# Patient Record
Sex: Male | Born: 1985 | Race: Black or African American | Hispanic: No | Smoking: Former smoker
Health system: Southern US, Community
[De-identification: ages and names within clinical notes are randomized; demographics above are authoritative.]

## PROBLEM LIST (undated history)

## (undated) ENCOUNTER — Emergency Department (HOSPITAL_COMMUNITY): Admission: EM | Payer: Self-pay

## (undated) HISTORY — PX: WRIST SURGERY: SHX841

---
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
## 2007-12-01 ENCOUNTER — Emergency Department (HOSPITAL_COMMUNITY): Admission: EM | Admit: 2007-12-01 | Discharge: 2007-12-01 | Payer: Self-pay | Admitting: Emergency Medicine

## 2009-07-16 ENCOUNTER — Emergency Department (HOSPITAL_COMMUNITY): Admission: EM | Admit: 2009-07-16 | Discharge: 2009-07-17 | Payer: Self-pay | Admitting: Emergency Medicine

## 2010-06-19 ENCOUNTER — Emergency Department (HOSPITAL_COMMUNITY)
Admission: EM | Admit: 2010-06-19 | Discharge: 2010-06-19 | Payer: Self-pay | Source: Home / Self Care | Admitting: Emergency Medicine

## 2011-02-23 LAB — URINALYSIS, ROUTINE W REFLEX MICROSCOPIC
Bilirubin Urine: NEGATIVE
Glucose, UA: NEGATIVE
Hgb urine dipstick: NEGATIVE
Ketones, ur: NEGATIVE
Nitrite: NEGATIVE
Protein, ur: NEGATIVE
Specific Gravity, Urine: 1.027
Urobilinogen, UA: 1
pH: 7.5

## 2011-02-23 LAB — POCT I-STAT, CHEM 8
Calcium, Ion: 1.07 — ABNORMAL LOW
Creatinine, Ser: 1.2
Glucose, Bld: 126 — ABNORMAL HIGH
Hemoglobin: 14.6
TCO2: 22

## 2011-02-23 LAB — CBC
Hemoglobin: 13
MCHC: 32.9
MCV: 85.1
RBC: 4.66

## 2011-02-23 LAB — RAPID URINE DRUG SCREEN, HOSP PERFORMED
Benzodiazepines: NOT DETECTED
Cocaine: NOT DETECTED
Tetrahydrocannabinol: POSITIVE — AB

## 2011-02-23 LAB — DIFFERENTIAL
Eosinophils Absolute: 0.2
Eosinophils Relative: 3
Lymphs Abs: 3.6
Monocytes Absolute: 0.8
Monocytes Relative: 9

## 2011-02-23 LAB — URINE CULTURE

## 2011-02-23 LAB — URINE MICROSCOPIC-ADD ON

## 2011-06-04 ENCOUNTER — Encounter: Payer: Self-pay | Admitting: Nurse Practitioner

## 2011-06-04 ENCOUNTER — Emergency Department (HOSPITAL_COMMUNITY)
Admission: EM | Admit: 2011-06-04 | Discharge: 2011-06-04 | Disposition: A | Discharge status: Home / Self Care | Payer: Self-pay | Attending: Emergency Medicine | Admitting: Emergency Medicine

## 2011-06-04 DIAGNOSIS — L0201 Cutaneous abscess of face: Secondary | ICD-10-CM | POA: Insufficient documentation

## 2011-06-04 DIAGNOSIS — R21 Rash and other nonspecific skin eruption: Secondary | ICD-10-CM | POA: Insufficient documentation

## 2011-06-04 DIAGNOSIS — L03211 Cellulitis of face: Secondary | ICD-10-CM | POA: Insufficient documentation

## 2011-06-04 MED ORDER — OXYCODONE-ACETAMINOPHEN 5-325 MG PO TABS: 2.0000 | ORAL_TABLET | ORAL | Status: AC | PRN

## 2011-06-04 MED ORDER — CEPHALEXIN 500 MG PO CAPS: ORAL_CAPSULE | ORAL | Status: AC

## 2011-06-04 MED ORDER — SULFAMETHOXAZOLE-TRIMETHOPRIM 800-160 MG PO TABS: 1.0000 | ORAL_TABLET | Freq: Two times a day (BID) | ORAL | Status: AC

## 2011-06-04 NOTE — ED Provider Notes (Signed)
Medical screening examination/treatment/procedure(s) were conducted as a shared visit with non-physician practitioner(s) and myself.  I personally evaluated the patient during the encounter  PA involved to perform procedure only.  Hurman Horn, MD 06/04/11 2126

## 2011-06-04 NOTE — ED Provider Notes (Signed)
History     CSN: [id number]  Arrival date & time 06/04/11  1046   First MD Initiated Contact with Patient 06/04/11 1101      Chief Complaint  Patient presents with  . Rash    (Consider location/radiation/quality/duration/timing/severity/associated sxs/prior treatment) HPI This 26 year old male has a couple of weeks of tender papules just under his chin in the submandibular jawline region with localized moderately severe tenderness without radiation or associated symptoms. He does not have any dental pain or intraoral pain. He has no odynophagia or dysphasia. He has no generalized neck swelling at all. He has no headache fever generalized rash shortness of breath chest pain or other concerns. He has not have any history to suggest that he is immunocompromised. History reviewed. No pertinent past medical history.  History reviewed. No pertinent past surgical history.  History reviewed. No pertinent family history.  History  Substance Use Topics  . Smoking status: Current Everyday Smoker -- 0.2 packs/day  . Smokeless tobacco: Not on file  . Alcohol Use: Yes     occassional      Review of Systems  Constitutional: Negative for fever.       10 Systems reviewed and are negative for acute change except as noted in the HPI.  HENT: Negative for congestion.   Eyes: Negative for discharge and redness.  Respiratory: Negative for cough and shortness of breath.   Cardiovascular: Negative for chest pain.  Gastrointestinal: Negative for vomiting and abdominal pain.  Musculoskeletal: Negative for back pain.  Skin: Positive for rash.  Neurological: Negative for syncope, numbness and headaches.  Psychiatric/Behavioral:       No behavior change.    Allergies  Review of patient's allergies indicates no known allergies.  Home Medications   Current Outpatient Rx  Name Route Sig Dispense Refill  . CEPHALEXIN 500 MG PO CAPS  2 caps po bid x 7 days 28 capsule 0  . OXYCODONE-ACETAMINOPHEN  5-325 MG PO TABS Oral Take 2 tablets by mouth every 4 (four) hours as needed for pain. 20 tablet 0  . SULFAMETHOXAZOLE-TRIMETHOPRIM 800-160 MG PO TABS Oral Take 1 tablet by mouth 2 (two) times daily. One po bid x 7 days 14 tablet 0    BP 115/77  Pulse 82  Temp(Src) 98.2 F (36.8 C) (Oral)  Resp 16  Ht 6\' 2"  (1.88 m)  Wt 160 lb (72.576 kg)  BMI 20.54 kg/m2  SpO2 99%  Physical Exam  Nursing note and vitals reviewed. Constitutional:       Awake, alert, nontoxic appearance.  HENT:  Head: Atraumatic.  Mouth/Throat: Oropharynx is clear and moist. No oropharyngeal exudate.       The patient's submandibular region just under his jawline under the chin shows several tender erythematous papules 2 of which are fluctuant with limited bedside ultrasound revealing small fluid collections in the subcutaneous space suggestive of 2 small abscesses, with some surrounding induration erythema and warmth of the skin consistent with localized cellulitis without subcutaneous emphysema and there is no diffuse neck swelling or tenderness at all the cellulitis and an apparent abscesses or well localized to the anterior submandibular region just under the mandible line itself  Eyes: Pupils are equal, round, and reactive to light. Right eye exhibits no discharge. Left eye exhibits no discharge.  Neck: Neck supple.  Cardiovascular: Normal rate and regular rhythm.   No murmur heard. Pulmonary/Chest: Effort normal and breath sounds normal. No respiratory distress. He has no wheezes. He has no rales. He exhibits  no tenderness.  Abdominal: Soft. There is no tenderness. There is no rebound.  Musculoskeletal: He exhibits no tenderness.       Baseline ROM, no obvious new focal weakness.  Lymphadenopathy:    He has no cervical adenopathy.  Neurological:       Mental status and motor strength appears baseline for patient and situation.  Skin: No rash noted.  Psychiatric: He has a normal mood and affect.    ED Course   Procedures (including critical care time) Medical screening examination/treatment/procedure(s) were conducted as a shared visit with non-physician practitioner(s) and myself.  I personally evaluated the patient during the encounter.  physician's assistant only performed incision and drainage procedures under my supervision. Labs Reviewed - No data to display No results found.   1. Abscess of face       MDM  I doubt any other EMC precluding discharge at this time including, but not necessarily limited to the following:deep space neck infection, airway compromise.        Hurman Horn, MD 06/04/11 2204

## 2011-06-04 NOTE — ED Provider Notes (Signed)
INCISION AND DRAINAGE Performed by: Lorenz Coaster Consent: Verbal consent obtained. Risks and benefits: risks, benefits and alternatives were discussed Time out performed prior to procedure. Type: abscess  Body area: chin- 2 areas to chin were incised (documented on same procedure note as same body part)  Anesthesia: local infiltration  Local anesthetic: lidocaine 2% without epinephrine  Anesthetic total: 1.5 ml  Complexity: complex Blunt dissection to break up loculations  Drainage: purulent  Drainage amount: Moderate amt purulent drainage expressed from left-sided abscess; small amt blood drainage from right-sided  Packing material: no packing placed  Patient tolerance: Patient tolerated the procedure well with no immediate complications.     Elwyn Reach [place], [place] 06/04/11 1210

## 2011-06-04 NOTE — ED Notes (Signed)
Pt with several bumps under chin that are painful, red, swollen, thinks they are hair bumps. X 2 weeks

## 2011-09-16 ENCOUNTER — Emergency Department (HOSPITAL_COMMUNITY)
Admission: EM | Admit: 2011-09-16 | Discharge: 2011-09-16 | Disposition: A | Discharge status: Home / Self Care | Payer: Self-pay | Attending: Emergency Medicine | Admitting: Emergency Medicine

## 2011-09-16 ENCOUNTER — Encounter (HOSPITAL_COMMUNITY): Payer: Self-pay | Admitting: Emergency Medicine

## 2011-09-16 DIAGNOSIS — R220 Localized swelling, mass and lump, head: Secondary | ICD-10-CM | POA: Insufficient documentation

## 2011-09-16 DIAGNOSIS — L0201 Cutaneous abscess of face: Secondary | ICD-10-CM | POA: Insufficient documentation

## 2011-09-16 DIAGNOSIS — L0390 Cellulitis, unspecified: Secondary | ICD-10-CM

## 2011-09-16 DIAGNOSIS — L03211 Cellulitis of face: Secondary | ICD-10-CM | POA: Insufficient documentation

## 2011-09-16 DIAGNOSIS — F17200 Nicotine dependence, unspecified, uncomplicated: Secondary | ICD-10-CM | POA: Insufficient documentation

## 2011-09-16 DIAGNOSIS — R221 Localized swelling, mass and lump, neck: Secondary | ICD-10-CM | POA: Insufficient documentation

## 2011-09-16 MED ORDER — SULFAMETHOXAZOLE-TRIMETHOPRIM 800-160 MG PO TABS: 1.0000 | ORAL_TABLET | Freq: Two times a day (BID) | ORAL | Status: AC

## 2011-09-16 MED ORDER — CLINDAMYCIN HCL 150 MG PO CAPS: 150.0000 mg | ORAL_CAPSULE | Freq: Four times a day (QID) | ORAL | Status: AC

## 2011-09-16 NOTE — Discharge Instructions (Signed)
Cellulitis Return to the Emergency Department right away if the infection becomes worse, you are unable to open your mouth, you develop fever of 102, you are unable to move your neck, or any other symptoms that are concerning to you.  Apply warm compresses to the area.  Take antibiotics as prescribed.  Cellulitis is an infection of the tissue under the skin. The infected area is usually red and tender. This is caused by germs. These germs enter the body through cuts or sores. This usually happens in the arms or lower legs. HOME CARE   Take your medicine as told. Finish it even if you start to feel better.   If the infection is on the arm or leg, keep it raised (elevated).   Use a warm cloth on the infected area several times a day.   See your doctor for a follow-up visit as told.  GET HELP RIGHT AWAY IF:   You are tired or confused.   You throw up (vomit).   You have watery poop (diarrhea).   You feel ill and have muscle aches.   You have a fever.  MAKE SURE YOU:   Understand these instructions.   Will watch your condition.   Will get help right away if you are not doing well or get worse.  Document Released: 11/01/2007 Document Revised: 05/04/2011 Document Reviewed: 04/16/2009 [facility] Patient Information 2012 [place], [name].

## 2011-09-16 NOTE — ED Notes (Signed)
Pt c/o facial swelling onset yesterday. Pt reports, "I think it's another abcess."

## 2011-09-16 NOTE — ED Provider Notes (Signed)
History     CSN: [id number]  Arrival date & time 09/16/11  5643   First MD Initiated Contact with Patient 09/16/11 1033      Chief Complaint  Patient presents with  . Facial Swelling    (Consider location/radiation/quality/duration/timing/severity/associated sxs/prior treatment) HPI Comments: Patient with history of abscesses comes in today with a chief complaint of right sided facial swelling since yesterday afternoon.  Swelling located just superior to the mandible on the right side.  He reports that the swelling has become progressively worse.  He also notes that the area has become thickened.  No drainage from the area.  He denies any fever or chills.  He denies any erythema or warmth to the area.  He denies any dental pain or dental abscess.  No trismus.  He is not diabetic.    The history is provided by the patient.    History reviewed. No pertinent past medical history.  History reviewed. No pertinent past surgical history.  History reviewed. No pertinent family history.  History  Substance Use Topics  . Smoking status: Current Everyday Smoker -- 0.2 packs/day  . Smokeless tobacco: Not on file  . Alcohol Use: Yes     occassional      Review of Systems  Constitutional: Negative for fever and chills.  HENT: Positive for facial swelling. Negative for drooling, trouble swallowing, neck pain, neck stiffness and voice change.   Respiratory: Negative for shortness of breath.   Gastrointestinal: Negative for vomiting.  Skin: Negative for color change.    Allergies  Review of patient's allergies indicates no known allergies.  Home Medications  No current outpatient prescriptions on file.  BP 119/76  Pulse 60  Temp(Src) 97.9 F (36.6 C) (Oral)  Resp 16  SpO2 100%  Physical Exam  Nursing note and vitals reviewed. Constitutional: He appears well-developed and well-nourished. No distress.  HENT:  Head: Normocephalic and atraumatic. No trismus in the jaw.  Right  Ear: Tympanic membrane normal.  Left Ear: Tympanic membrane normal.  Mouth/Throat: Uvula is midline, oropharynx is clear and moist and mucous membranes are normal. No dental abscesses or uvula swelling.  Neck: Normal range of motion. Neck supple.  Cardiovascular: Normal rate, regular rhythm and normal heart sounds.   Pulmonary/Chest: Effort normal and breath sounds normal.  Neurological: He is alert.  Skin: He is not diaphoretic.       Swelling and induration just superior to the mandible on the right side.  No warmth or obvious erythema.  No fluctuance present.  Psychiatric: He has a normal mood and affect.    ED Course  Procedures (including critical care time)  Labs Reviewed - No data to display No results found.   No diagnosis found.    MDM  Facial abscess not amendable to drainage at this time.  Patient afebrile.  No trismus.  Full ROM of the neck. No acute distress.  Patient given Antibiotics and instructed to apply warm compresses and follow up with PCP.  Return precautions discussed with patient.          Pascal Lux [place], PA-C 09/16/11 1821

## 2011-09-17 NOTE — ED Provider Notes (Signed)
Medical screening examination/treatment/procedure(s) were performed by non-physician practitioner and as supervising physician I was immediately available for consultation/collaboration.   Nitisha Civello, MD 09/17/11 2048 

## 2012-10-27 ENCOUNTER — Encounter (HOSPITAL_COMMUNITY): Payer: Self-pay | Admitting: Emergency Medicine

## 2012-10-27 ENCOUNTER — Emergency Department (HOSPITAL_COMMUNITY)
Admission: EM | Admit: 2012-10-27 | Discharge: 2012-10-27 | Disposition: A | Discharge status: Home / Self Care | Payer: Self-pay | Attending: Emergency Medicine | Admitting: Emergency Medicine

## 2012-10-27 DIAGNOSIS — Y939 Activity, unspecified: Secondary | ICD-10-CM | POA: Insufficient documentation

## 2012-10-27 DIAGNOSIS — L02414 Cutaneous abscess of left upper limb: Secondary | ICD-10-CM

## 2012-10-27 DIAGNOSIS — T63391A Toxic effect of venom of other spider, accidental (unintentional), initial encounter: Secondary | ICD-10-CM | POA: Insufficient documentation

## 2012-10-27 DIAGNOSIS — F17200 Nicotine dependence, unspecified, uncomplicated: Secondary | ICD-10-CM | POA: Insufficient documentation

## 2012-10-27 DIAGNOSIS — Y929 Unspecified place or not applicable: Secondary | ICD-10-CM | POA: Insufficient documentation

## 2012-10-27 DIAGNOSIS — IMO0002 Reserved for concepts with insufficient information to code with codable children: Secondary | ICD-10-CM | POA: Insufficient documentation

## 2012-10-27 MED ORDER — SULFAMETHOXAZOLE-TRIMETHOPRIM 800-160 MG PO TABS: 1.0000 | ORAL_TABLET | Freq: Two times a day (BID) | ORAL | Status: DC

## 2012-10-27 MED ORDER — CEPHALEXIN 500 MG PO CAPS: 500.0000 mg | ORAL_CAPSULE | Freq: Two times a day (BID) | ORAL | Status: DC

## 2012-10-27 MED ORDER — IBUPROFEN 800 MG PO TABS: 800.0000 mg | ORAL_TABLET | Freq: Three times a day (TID) | ORAL | Status: DC

## 2012-10-27 MED ORDER — HYDROCODONE-ACETAMINOPHEN 5-325 MG PO TABS: 1.0000 | ORAL_TABLET | Freq: Four times a day (QID) | ORAL | Status: DC | PRN

## 2012-10-27 NOTE — ED Notes (Signed)
?   Spider bite to L elbow x 2 days.  C/o site draining today.

## 2012-10-27 NOTE — ED Notes (Signed)
Cleaned left elbow with wound cleanser placed bacitracin and 2x2 gauze with kerlex patient tolerated without incident.

## 2012-10-27 NOTE — ED Provider Notes (Signed)
History    This chart was scribed for non-physician practitioner, Lottie Mussel, PA-C, working with Geoffery Lyons, MD by Melba Coon, ED Scribe. This patient was seen in room TR08C/TR08C and the patient's care was started at 5:22PM.   CSN: [id number]  Arrival date & time 10/27/12  1514   None     Chief Complaint  Patient presents with  . Insect Bite    (Consider location/radiation/quality/duration/timing/severity/associated sxs/prior treatment) The history is provided by the patient. No language interpreter was used.   HPI Comments: Nicholas Webster is a 27 y.o. male who presents to the Emergency Department complaining of an abscess with constant, moderate pain to the left elbow with an onset 2 days ago with spreading erythema and warmth to his left arm. He reports a history of similar boils, particularly under his chin where his beard hair grows. He reports he thinks it has been spider bites causing these boils, but he did not see a spider bite him around onset. The abscess started draining today per his report. He reports that he cannot bend his arm without pain. Warm compresses alleviate his pain. He has never been tested for MRSA. No known allergies. No other pertinent medical symptoms.  History reviewed. No pertinent past medical history.  History reviewed. No pertinent past surgical history.  No family history on file.  History  Substance Use Topics  . Smoking status: Current Every Day Smoker -- 0.20 packs/day  . Smokeless tobacco: Not on file  . Alcohol Use: Yes     Comment: occassional      Review of Systems  Constitutional: Negative for fever and diaphoresis.  HENT: Negative for neck pain and neck stiffness.   Eyes: Negative for visual disturbance.  Respiratory: Negative for apnea, chest tightness and shortness of breath.   Cardiovascular: Negative for chest pain and palpitations.  Gastrointestinal: Negative for nausea, vomiting, diarrhea and constipation.   Genitourinary: Negative for dysuria.  Musculoskeletal: Negative for gait problem.  Skin: Positive for wound (spider bite). Negative for rash.  Neurological: Negative for dizziness, weakness, light-headedness, numbness and headaches.  All other systems reviewed and are negative.    Allergies  Review of patient's allergies indicates no known allergies.  Home Medications   Current Outpatient Rx  Name  Route  Sig  Dispense  Refill  . ibuprofen (ADVIL,MOTRIN) 200 MG tablet   Oral   Take 600 mg by mouth every 6 (six) hours as needed for pain.           BP 116/69  Pulse 55  Temp(Src) 98.2 F (36.8 C) (Oral)  Resp 16  SpO2 100%  Physical Exam  Nursing note and vitals reviewed. Constitutional: He is oriented to person, place, and time. He appears well-developed. No distress.  HENT:  Head: Normocephalic and atraumatic.  Eyes: Conjunctivae are normal.  Neck: Normal range of motion. No tracheal deviation present.  Cardiovascular: Normal rate.   Pulmonary/Chest: Effort normal. No respiratory distress.  Abdominal: Soft.  Musculoskeletal: Normal range of motion. He exhibits tenderness. He exhibits no edema.  3x3 cm draining superficial abscess to the left elbow with purulent drainage and TTP. Mild surrounding cellulitis. Full ROM of the left elbow with pain.  Neurological: He is alert and oriented to person, place, and time.  Skin: Skin is warm. No rash noted. He is not diaphoretic.  Psychiatric: He has a normal mood and affect. His behavior is normal.    ED Course  Procedures (including critical care time)  COORDINATION  OF CARE:  5:28PM - Keflex and bactrim will be ordered for [employer]. Area is already well draining on its owning  so I&D is not indicated. More purulent drainage is extracted from the area (about 1-2 cc here at the ED) and will be sent to the lab for culture. Advised to continue to apply warm compresses to the affected area. He is ready for  d/c.   Labs Reviewed - No data to display No results found.   1. Abscess of left arm       MDM  Pt with skin abscess to the left elbow. Full ROM of the elbow with no pain, doubt septic joint. Pt with hx of biols in the past, sent culture for MRSA testing. Abscess is spontaneously draining. Used pressure to express most of the pus, large purulent drainage expressed. Dressing applied. Home with keflex and bactrim. Pain medications. Recheck in two days.   Filed Vitals:   10/27/12 1519  BP: 116/69  Pulse: 55  Temp: 98.2 F (36.8 C)  TempSrc: Oral  Resp: 16  SpO2: 100%    I personally performed the services described in this documentation, which was scribed in my presence. The recorded information has been reviewed and is accurate.        Lottie Mussel, PA-C 10/27/12 1739

## 2012-10-28 NOTE — ED Provider Notes (Signed)
Medical screening examination/treatment/procedure(s) were performed by non-physician practitioner and as supervising physician I was immediately available for consultation/collaboration.  Tasharra Nodine, MD 10/28/12 0826 

## 2012-10-30 ENCOUNTER — Telehealth (HOSPITAL_COMMUNITY): Payer: Self-pay | Admitting: Emergency Medicine

## 2012-10-30 LAB — WOUND CULTURE

## 2012-10-30 NOTE — Telephone Encounter (Signed)
Patient notified of + MRSA wound culture. Infection control instructions provided, patient verbalized understanding.

## 2012-10-30 NOTE — ED Notes (Signed)
+   wound culture + MRSA.  treated with Septra DS, sensitive to same. Will call and notify patient

## 2012-10-31 ENCOUNTER — Telehealth (HOSPITAL_COMMUNITY): Payer: Self-pay | Admitting: Emergency Medicine

## 2012-10-31 NOTE — ED Notes (Signed)
Post ED Visit - Positive Culture Follow-up  Culture report reviewed by antimicrobial stewardship pharmacist: []  Wes Dulaney, Pharm.D., BCPS []  Celedonio Miyamoto, Pharm.D., BCPS [x]  Georgina Pillion, Pharm.D., BCPS []  [place], [address].D., BCPS, AAHIVP []  Estella Husk, Pharm.D., BCPS, AAHIVP  Positive urine culture Treated with Bactrim, organism sensitive to the same and no further patient follow-up is required at this time.  Kylie A Holland 10/31/2012, 12:25 PM

## 2013-09-18 ENCOUNTER — Encounter (HOSPITAL_COMMUNITY): Payer: Self-pay | Admitting: Emergency Medicine

## 2013-09-18 ENCOUNTER — Emergency Department (HOSPITAL_COMMUNITY): Payer: Self-pay

## 2013-09-18 ENCOUNTER — Emergency Department (HOSPITAL_COMMUNITY)
Admission: EM | Admit: 2013-09-18 | Discharge: 2013-09-18 | Disposition: A | Discharge status: Home / Self Care | Payer: Self-pay | Attending: Emergency Medicine | Admitting: Emergency Medicine

## 2013-09-18 DIAGNOSIS — F17200 Nicotine dependence, unspecified, uncomplicated: Secondary | ICD-10-CM | POA: Insufficient documentation

## 2013-09-18 DIAGNOSIS — M25561 Pain in right knee: Secondary | ICD-10-CM

## 2013-09-18 DIAGNOSIS — L03119 Cellulitis of unspecified part of limb: Principal | ICD-10-CM

## 2013-09-18 DIAGNOSIS — L02419 Cutaneous abscess of limb, unspecified: Secondary | ICD-10-CM | POA: Insufficient documentation

## 2013-09-18 DIAGNOSIS — L03115 Cellulitis of right lower limb: Secondary | ICD-10-CM

## 2013-09-18 LAB — CBC WITH DIFFERENTIAL/PLATELET
BASOS PCT: 0 % (ref 0–1)
Basophils Absolute: 0 10*3/uL (ref 0.0–0.1)
EOS ABS: 0 10*3/uL (ref 0.0–0.7)
EOS PCT: 0 % (ref 0–5)
HCT: 39.7 % (ref 39.0–52.0)
HEMOGLOBIN: 12.9 g/dL — AB (ref 13.0–17.0)
LYMPHS ABS: 3.7 10*3/uL (ref 0.7–4.0)
Lymphocytes Relative: 30 % (ref 12–46)
MCH: 27.4 pg (ref 26.0–34.0)
MCHC: 32.5 g/dL (ref 30.0–36.0)
MCV: 84.3 fL (ref 78.0–100.0)
MONO ABS: 1.6 10*3/uL — AB (ref 0.1–1.0)
Monocytes Relative: 13 % — ABNORMAL HIGH (ref 3–12)
NEUTROS PCT: 57 % (ref 43–77)
Neutro Abs: 7 10*3/uL (ref 1.7–7.7)
PLATELETS: 218 10*3/uL (ref 150–400)
RBC: 4.71 MIL/uL (ref 4.22–5.81)
RDW: 13.7 % (ref 11.5–15.5)
WBC: 12.3 10*3/uL — ABNORMAL HIGH (ref 4.0–10.5)

## 2013-09-18 LAB — BASIC METABOLIC PANEL
BUN: 13 mg/dL (ref 6–23)
CALCIUM: 9.3 mg/dL (ref 8.4–10.5)
CO2: 28 meq/L (ref 19–32)
CREATININE: 1.02 mg/dL (ref 0.50–1.35)
Chloride: 101 mEq/L (ref 96–112)
GFR calc Af Amer: >90 mL/min (ref >90)
GLUCOSE: 115 mg/dL — AB (ref 70–99)
Potassium: 4.7 mEq/L (ref 3.7–5.3)
SODIUM: 139 meq/L (ref 137–147)

## 2013-09-18 LAB — I-STAT CG4 LACTIC ACID, ED: Lactic Acid, Venous: 1.3 mmol/L (ref 0.5–2.2)

## 2013-09-18 MED ORDER — CEPHALEXIN 250 MG PO CAPS: 250.0000 mg | ORAL_CAPSULE | Freq: Four times a day (QID) | ORAL | Status: DC

## 2013-09-18 MED ORDER — HYDROCODONE-ACETAMINOPHEN 5-325 MG PO TABS
2.0000 | ORAL_TABLET | Freq: Once | ORAL | Status: AC
  Administered 2013-09-18: 2 via ORAL
  Filled 2013-09-18: qty 2

## 2013-09-18 MED ORDER — SULFAMETHOXAZOLE-TRIMETHOPRIM 800-160 MG PO TABS: 1.0000 | ORAL_TABLET | Freq: Two times a day (BID) | ORAL | Status: AC

## 2013-09-18 NOTE — ED Provider Notes (Signed)
Medical screening examination/treatment/procedure(s) were performed by non-physician practitioner and as supervising physician I was immediately available for consultation/collaboration.   EKG Interpretation None       Elijah Michaelis M Steffie Waggoner, MD 09/18/13 0559 

## 2013-09-18 NOTE — Discharge Instructions (Signed)
Cellulitis Cellulitis is an infection of the skin and the tissue beneath it. The infected area is usually red and tender. Cellulitis occurs most often in the arms and lower legs.  CAUSES  Cellulitis is caused by bacteria that enter the skin through cracks or cuts in the skin. The most common types of bacteria that cause cellulitis are Staphylococcus and Streptococcus. SYMPTOMS   Redness and warmth.  Swelling.  Tenderness or pain.  Fever. DIAGNOSIS  Your caregiver can usually determine what is wrong based on a physical exam. Blood tests may also be done. TREATMENT  Treatment usually involves taking an antibiotic medicine. HOME CARE INSTRUCTIONS   Take your antibiotics as directed. Finish them even if you start to feel better.  Keep the infected arm or leg elevated to reduce swelling.  Apply a warm cloth to the affected area up to 4 times per day to relieve pain.  Only take over-the-counter or prescription medicines for pain, discomfort, or fever as directed by your caregiver.  Keep all follow-up appointments as directed by your caregiver. SEEK MEDICAL CARE IF:   You notice red streaks coming from the infected area.  Your red area gets larger or turns dark in color.  Your bone or joint underneath the infected area becomes painful after the skin has healed.  Your infection returns in the same area or another area.  You notice a swollen bump in the infected area.  You develop new symptoms. SEEK IMMEDIATE MEDICAL CARE IF:   You have a fever.  You feel very sleepy.  You develop vomiting or diarrhea.  You have a general ill feeling (malaise) with muscle aches and pains. MAKE SURE YOU:   Understand these instructions.  Will watch your condition.  Will get help right away if you are not doing well or get worse. Document Released: 02/22/2005 Document Revised: 11/14/2011 Document Reviewed: 07/31/2011 [employer] Patient Information 2014 [employer].  

## 2013-09-18 NOTE — ED Notes (Signed)
Pt reports a insect bite to R knee that he noticed 3 days ago and now has became swollen and has a scab over it. Pt states that area is painful. Pt ambulatory to triage area. Family in room.

## 2013-09-18 NOTE — ED Provider Notes (Signed)
CSN: [id number]     Arrival date & time 09/18/13  0101 History   First MD Initiated Contact with Patient 09/18/13 0123     Chief Complaint  Patient presents with  . Insect Bite    (Consider location/radiation/quality/duration/timing/severity/associated sxs/prior Treatment) HPI Comments: Patient is a 28 year old otherwise healthy male who presents to the emergency department for right knee swelling. Patient states that he believed a spider bit him 3 days ago. Patient did not see the spider. He denies taking anything for symptoms. He states that since onset he has become increasingly swollen. Patient also endorses drainage yesterday which has since scabbed over. Patient endorses pain that is nonradiating and worse with knee flexion. Patient denies associated trauma or injury to his knee, fever, red streaking, numbness/tingling, and extremity weakness.  The history is provided by the patient. No language interpreter was used.    History reviewed. No pertinent past medical history. History reviewed. No pertinent past surgical history. History reviewed. No pertinent family history. History  Substance Use Topics  . Smoking status: Current Every Day Smoker -- 0.20 packs/day  . Smokeless tobacco: Not on file  . Alcohol Use: Yes     Comment: occassional    Review of Systems  Constitutional: Negative for fever.  Musculoskeletal: Positive for arthralgias, joint swelling and myalgias.  Skin: Positive for color change.  Neurological: Negative for weakness and numbness.  All other systems reviewed and are negative.     Allergies  Review of patient's allergies indicates no known allergies.  Home Medications   Prior to Admission medications   Medication Sig Start Date End Date Taking? Authorizing Provider  ibuprofen (ADVIL,MOTRIN) 200 MG tablet Take 600 mg by mouth every 6 (six) hours as needed for pain.   Yes Historical Provider, MD   BP 121/75  Pulse 60  Temp(Src) 98.2 F (36.8 C)  (Oral)  Resp 16  SpO2 99%  Physical Exam  Nursing note and vitals reviewed. Constitutional: He is oriented to person, place, and time. He appears well-developed and well-nourished. No distress.  HENT:  Head: Normocephalic and atraumatic.  Mouth/Throat: Oropharynx is clear and moist. No oropharyngeal exudate.  Eyes: Conjunctivae and EOM are normal. No scleral icterus.  Neck: Normal range of motion.  Cardiovascular: Normal rate, regular rhythm and intact distal pulses.   Pulmonary/Chest: Effort normal. No respiratory distress.  Musculoskeletal: Normal range of motion.       Right knee: He exhibits swelling and erythema. He exhibits normal range of motion, no effusion, no ecchymosis, no deformity, normal alignment, no LCL laxity and no MCL laxity. Tenderness (diffuse to anteror R knee) found.       Right upper leg: Normal.       Right lower leg: Normal.  Patient with swelling to anterior inferior R knee with erythema and mild heat to touch. Purulent pocket noted directly under surface of skin. Finding suggestive of soft tissue abscess and cellulitis.  Neurological: He is alert and oriented to person, place, and time. He exhibits normal muscle tone.  No gross sensory deficits appreciated to RLE. Patient able to wiggle all toes. Patellar and achilles reflexes 2+ b/l.  Skin: Skin is warm and dry. No rash noted. No erythema. No pallor.  Psychiatric: He has a normal mood and affect. His behavior is normal.    ED Course  Procedures (including critical care time) Labs Review Labs Reviewed  CBC WITH DIFFERENTIAL - Abnormal; Notable for the following:    WBC 12.3 (*)    Hemoglobin  12.9 (*)    Monocytes Relative 13 (*)    Monocytes Absolute 1.6 (*)    All other components within normal limits  BASIC METABOLIC PANEL - Abnormal; Notable for the following:    Glucose, Bld 115 (*)    All other components within normal limits  WOUND CULTURE  I-STAT CG4 LACTIC ACID, ED    Imaging Review Dg  Knee Complete 4 Views Right  09/18/2013   CLINICAL DATA:  Insect bite  EXAM: RIGHT KNEE - COMPLETE 4+ VIEW  COMPARISON:  None.  FINDINGS: No acute fracture. No dislocation. Soft tissue swelling over the anterior proximal tibia is noted.  IMPRESSION: No acute bony pathology. Soft tissue swelling over the proximal tibia.   Electronically Signed   By: Maryclare BeanArt  Hoss M.D.   On: 09/18/2013 02:35     EKG Interpretation None     INCISION AND DRAINAGE Performed by: Antony MaduraKelly Samit Sylve Consent: Verbal consent obtained. Risks and benefits: risks, benefits and alternatives were discussed Type: abscess  Body area: R knee  Anesthesia: local infiltration  Incision was made with a scalpel.  Local anesthetic: lidocaine 1% with epinephrine  Anesthetic total: 7 ml  Complexity: complex Blunt dissection to break up loculations  Drainage: purulent  Drainage amount: moderate  Packing material: none  Patient tolerance: Patient tolerated the procedure well with no immediate complications.   MDM   Final diagnoses:  Knee pain, right  Cellulitis of knee, right    Patient presents to pain and swelling to R knee x 3 days. Patient believes he was bit by a spider. He is neurovascularly intact without sensory deficits on exam. Physical exam findings c/w with abscess and cellulitis. Do not suspect septic joint in this patient. No bony pathology or subcutaneous gas on Xray. I&D performed at bedside which patient tolerated well. Will d/c with Bactrim and Keflex with instruction to return if symptoms worsen. Patient agreeable to plan with no unaddressed concerns.  Patient seen also by Dr. Norlene Campbelltter who is in agreement with this workup, assessment, management plan, and patient's stability for discharge.   Filed Vitals:   09/18/13 0106 09/18/13 0423 09/18/13 0501  BP: 122/63 121/75   Pulse: 79 60   Temp: 98.2 F (36.8 C)    TempSrc: Oral    Resp: 18  16  SpO2: 100% 98% 99%     Antony MaduraKelly Shanon Seawright, PA-C 09/18/13 0522

## 2013-09-20 LAB — WOUND CULTURE

## 2013-09-21 ENCOUNTER — Telehealth (HOSPITAL_BASED_OUTPATIENT_CLINIC_OR_DEPARTMENT_OTHER): Payer: Self-pay | Admitting: Emergency Medicine

## 2013-09-21 NOTE — Telephone Encounter (Signed)
Post ED Visit - Positive Culture Follow-up  Culture report reviewed by antimicrobial stewardship pharmacist: []  Wes Dulaney, Pharm.D., BCPS []  Celedonio MiyamotoJeremy Frens, Pharm.D., BCPS [x]  Georgina PillionElizabeth Martin, [address]Ph[address]D., BCPS []  ClearlakeMinh Pham, [place]D., BCPS, AAHIVP []  Estella HuskMichelle Turner, Pharm.D., BCPS, AAHIVP []  Harvie JuniorNathan Cope, Pharm.D.  Positive wound culture Treated with Sulfa-Trimeth, organism sensitive to the same and no further patient follow-up is required at this time.  Shondell Poulson 09/21/2013, 3:05 PM

## 2014-01-21 ENCOUNTER — Encounter (HOSPITAL_COMMUNITY): Payer: Self-pay | Admitting: Emergency Medicine

## 2014-01-21 ENCOUNTER — Emergency Department (HOSPITAL_COMMUNITY)
Admission: EM | Admit: 2014-01-21 | Discharge: 2014-01-21 | Disposition: A | Discharge status: Home / Self Care | Payer: Self-pay | Attending: Family Medicine | Admitting: Family Medicine

## 2014-01-21 DIAGNOSIS — M62830 Muscle spasm of back: Secondary | ICD-10-CM

## 2014-01-21 DIAGNOSIS — M542 Cervicalgia: Secondary | ICD-10-CM | POA: Insufficient documentation

## 2014-01-21 DIAGNOSIS — M549 Dorsalgia, unspecified: Secondary | ICD-10-CM | POA: Insufficient documentation

## 2014-01-21 DIAGNOSIS — F17200 Nicotine dependence, unspecified, uncomplicated: Secondary | ICD-10-CM | POA: Insufficient documentation

## 2014-01-21 DIAGNOSIS — Z79899 Other long term (current) drug therapy: Secondary | ICD-10-CM | POA: Insufficient documentation

## 2014-01-21 DIAGNOSIS — M62838 Other muscle spasm: Secondary | ICD-10-CM | POA: Insufficient documentation

## 2014-01-21 MED ORDER — CYCLOBENZAPRINE HCL 10 MG PO TABS: 10.0000 mg | ORAL_TABLET | Freq: Two times a day (BID) | ORAL | Status: DC | PRN

## 2014-01-21 MED ORDER — IBUPROFEN 800 MG PO TABS: 800.0000 mg | ORAL_TABLET | Freq: Three times a day (TID) | ORAL | Status: DC

## 2014-01-21 NOTE — ED Notes (Signed)
Reports neck and upper back pain x 3 days. Pain increases with movement. Ambulatory at triage, no acute distress noted.

## 2014-01-21 NOTE — Discharge Instructions (Signed)
Back Exercises °Back exercises help treat and prevent back injuries. The goal of back exercises is to increase the strength of your abdominal and back muscles and the flexibility of your back. These exercises should be started when you no longer have back pain. Back exercises include: °· Pelvic Tilt. Lie on your back with your knees bent. Tilt your pelvis until the lower part of your back is against the floor. Hold this position 5 to 10 sec and repeat 5 to 10 times. °· Knee to Chest. Pull first 1 knee up against your chest and hold for 20 to 30 seconds, repeat this with the other knee, and then both knees. This may be done with the other leg straight or bent, whichever feels better. °· Sit-Ups or Curl-Ups. Bend your knees 90 degrees. Start with tilting your pelvis, and do a partial, slow sit-up, lifting your trunk only 30 to 45 degrees off the floor. Take at least 2 to 3 seconds for each sit-up. Do not do sit-ups with your knees out straight. If partial sit-ups are difficult, simply do the above but with only tightening your abdominal muscles and holding it as directed. °· Hip-Lift. Lie on your back with your knees flexed 90 degrees. Push down with your feet and shoulders as you raise your hips a couple inches off the floor; hold for 10 seconds, repeat 5 to 10 times. °· Back arches. Lie on your stomach, propping yourself up on bent elbows. Slowly press on your hands, causing an arch in your low back. Repeat 3 to 5 times. Any initial stiffness and discomfort should lessen with repetition over time. °· Shoulder-Lifts. Lie face down with arms beside your body. Keep hips and torso pressed to floor as you slowly lift your head and shoulders off the floor. °Do not overdo your exercises, especially in the beginning. Exercises may cause you some mild back discomfort which lasts for a few minutes; however, if the pain is more severe, or lasts for more than 15 minutes, do not continue exercises until you see your caregiver.  Improvement with exercise therapy for back problems is slow.  °See your caregivers for assistance with developing a proper back exercise program. °Document Released: 06/22/2004 Document Revised: 08/07/2011 Document Reviewed: 03/16/2011 °ExitCare® Patient Information ©2015 [employer]. This information is not intended to replace advice given to you by your health care provider. Make sure you discuss any questions you have with your health care provider. °Muscle Cramps and Spasms °Muscle cramps and spasms occur when a muscle or muscles tighten and you have no control over this tightening (involuntary muscle contraction). They are a common problem and can develop in any muscle. The most common place is in the calf muscles of the leg. Both muscle cramps and muscle spasms are involuntary muscle contractions, but they also have differences:  °· Muscle cramps are sporadic and painful. They may last a few seconds to a quarter of an hour. Muscle cramps are often more forceful and last longer than muscle spasms. °· Muscle spasms may or may not be painful. They may also last just a few seconds or much longer. °CAUSES  °It is uncommon for cramps or spasms to be due to a serious underlying problem. In many cases, the cause of cramps or spasms is unknown. Some common causes are:  °· Overexertion.   °· Overuse from repetitive motions (doing the same thing over and over).   °· Remaining in a certain position for a long period of time.   °· Improper   preparation, form, or technique while performing a sport or activity.   °· Dehydration.   °· Injury.   °· Side effects of some medicines.   °· Abnormally low levels of the salts and ions in your blood (electrolytes), especially potassium and calcium. This could happen if you are taking water pills (diuretics) or you are pregnant.   °Some underlying medical problems can make it more likely to develop cramps or spasms. These include, but are not limited to:  °· Diabetes.   °· Parkinson  disease.   °· Hormone disorders, such as thyroid problems.   °· Alcohol abuse.   °· Diseases specific to muscles, joints, and bones.   °· Blood vessel disease where not enough blood is getting to the muscles.   °HOME CARE INSTRUCTIONS  °· Stay well hydrated. Drink enough water and fluids to keep your urine clear or pale yellow. °· It may be helpful to massage, stretch, and relax the affected muscle. °· For tight or tense muscles, use a warm towel, heating pad, or hot shower water directed to the affected area. °· If you are sore or have pain after a cramp or spasm, applying ice to the affected area may relieve discomfort. °¨ Put ice in a plastic bag. °¨ Place a towel between your skin and the bag. °¨ Leave the ice on for 15-20 minutes, 03-04 times a day. °· Medicines used to treat a known cause of cramps or spasms may help reduce their frequency or severity. Only take over-the-counter or prescription medicines as directed by your caregiver. °SEEK MEDICAL CARE IF:  °Your cramps or spasms get more severe, more frequent, or do not improve over time.  °MAKE SURE YOU:  °· Understand these instructions. °· Will watch your condition. °· Will get help right away if you are not doing well or get worse. °Document Released: 11/04/2001 Document Revised: 09/09/2012 Document Reviewed: 05/01/2012 °ExitCare® Patient Information ©2015 [employer]. This information is not intended to replace advice given to you by your health care provider. Make sure you discuss any questions you have with your health care provider. ° °

## 2014-01-21 NOTE — ED Provider Notes (Signed)
CSN: [id number]     Arrival date & time 01/21/14  1521 History   First MD Initiated Contact with Patient 01/21/14 1708     Chief Complaint  Patient presents with  . Neck Pain  . Back Pain     (Consider location/radiation/quality/duration/timing/severity/associated sxs/prior Treatment) HPI Comments: 28 year old male presents for evaluation of neck and upper back pain for 3 days. 3 days, when he does certain movements with his arms, or any twisting movements, he will have spasms of pain that last for about one second. He rates the pain as severe when this happens. In between these episodes of sharp pain, he has only mild to moderate pain. He denies any specific injury, extremity numbness or weakness, loss of bowel or bladder control.  Patient is a 28 y.o. male presenting with neck pain and back pain.  Neck Pain Back Pain   History reviewed. No pertinent past medical history. History reviewed. No pertinent past surgical history. History reviewed. No pertinent family history. History  Substance Use Topics  . Smoking status: Current Every Day Smoker -- 0.20 packs/day  . Smokeless tobacco: Not on file  . Alcohol Use: Yes     Comment: occassional    Review of Systems  Musculoskeletal: Positive for back pain and neck pain.  All other systems reviewed and are negative.     Allergies  Review of patient's allergies indicates no known allergies.  Home Medications   Prior to Admission medications   Medication Sig Start Date End Date Taking? Authorizing Provider  cephALEXin (KEFLEX) 250 MG capsule Take 1 capsule (250 mg total) by mouth 4 (four) times daily. 09/18/13   Antony Madura, PA-C  cyclobenzaprine (FLEXERIL) 10 MG tablet Take 1 tablet (10 mg total) by mouth 2 (two) times daily as needed for muscle spasms. 01/21/14   Graylon Good, PA-C  ibuprofen (ADVIL,MOTRIN) 200 MG tablet Take 600 mg by mouth every 6 (six) hours as needed for pain.    Historical Provider, MD  ibuprofen  (ADVIL,MOTRIN) 800 MG tablet Take 1 tablet (800 mg total) by mouth 3 (three) times daily. 01/21/14   Adrian Blackwater Aliegha Paullin, PA-C   BP 125/72  Pulse 59  Temp(Src) 98.4 F (36.9 C) (Oral)  Resp 16  Ht  (1.88 m)  Wt 150 lb (68.04 kg)  BMI 19.25 kg/m2  SpO2 100% Physical Exam  Nursing note and vitals reviewed. Constitutional: He is oriented to person, place, and time. He appears well-developed and well-nourished. No distress.  HENT:  Head: Normocephalic.  Pulmonary/Chest: Effort normal. No respiratory distress.  Musculoskeletal:       Cervical back: Normal. He exhibits normal range of motion, no tenderness and no deformity.       Thoracic back: Normal. He exhibits normal range of motion, no tenderness and no deformity.       Lumbar back: He exhibits normal range of motion, no tenderness and no deformity.  Neurological: He is alert and oriented to person, place, and time. He has normal strength. No cranial nerve deficit or sensory deficit. He exhibits normal muscle tone. Coordination and gait normal.  Skin: Skin is warm and dry. No rash noted. He is not diaphoretic.  Psychiatric: He has a normal mood and affect. Judgment normal.    ED Course  Procedures (including critical care time) Labs Review Labs Reviewed - No data to display  Imaging Review No results found.   EKG Interpretation None      MDM   Final diagnoses:  Muscle  spasm of back    Treat symptomatically with ibuprofen and Flexeril. Follow up when necessary.   Meds ordered this encounter  Medications  . cyclobenzaprine (FLEXERIL) 10 MG tablet    Sig: Take 1 tablet (10 mg total) by mouth 2 (two) times daily as needed for muscle spasms.    Dispense:  30 tablet    Refill:  0    Order Specific Question:  Supervising Provider    Answer:  Linna Hoff [phone]  . ibuprofen (ADVIL,MOTRIN) 800 MG tablet    Sig: Take 1 tablet (800 mg total) by mouth 3 (three) times daily.    Dispense:  30 tablet    Refill:  0     Order Specific Question:  Supervising Provider    Answer:  Bradd Canary D [[id number]]       Graylon Good, PA-C 01/21/14 1747

## 2014-01-21 NOTE — ED Provider Notes (Signed)
Medical screening examination/treatment/procedure(s) were performed by resident physician or non-physician practitioner and as supervising physician I was immediately available for consultation/collaboration.   Declan Mier DOUGLAS MD.   Alexis Reber D Ahmon Tosi, MD 01/21/14 1839 

## 2014-03-23 ENCOUNTER — Encounter (HOSPITAL_COMMUNITY): Payer: Self-pay | Admitting: Emergency Medicine

## 2014-03-23 ENCOUNTER — Emergency Department (HOSPITAL_COMMUNITY)
Admission: EM | Admit: 2014-03-23 | Discharge: 2014-03-23 | Disposition: A | Discharge status: Home / Self Care | Payer: Self-pay | Attending: Emergency Medicine | Admitting: Emergency Medicine

## 2014-03-23 DIAGNOSIS — M545 Low back pain: Secondary | ICD-10-CM | POA: Insufficient documentation

## 2014-03-23 DIAGNOSIS — M542 Cervicalgia: Secondary | ICD-10-CM | POA: Insufficient documentation

## 2014-03-23 DIAGNOSIS — Z720 Tobacco use: Secondary | ICD-10-CM | POA: Insufficient documentation

## 2014-03-23 DIAGNOSIS — Z791 Long term (current) use of non-steroidal anti-inflammatories (NSAID): Secondary | ICD-10-CM | POA: Insufficient documentation

## 2014-03-23 DIAGNOSIS — Z792 Long term (current) use of antibiotics: Secondary | ICD-10-CM | POA: Insufficient documentation

## 2014-03-23 MED ORDER — CYCLOBENZAPRINE HCL 10 MG PO TABS: 10.0000 mg | ORAL_TABLET | Freq: Two times a day (BID) | ORAL | Status: DC | PRN

## 2014-03-23 NOTE — Discharge Instructions (Signed)
Return to the emergency room with worsening of symptoms, new symptoms or with symptoms that are concerning, especially fevers, loss of control of bladder or bowels, numbness or tingling around genital region or anus, weakness. RICE: Rest, Ice (three cycles of 20 mins on, 20mins off at least twice a day), compression/brace, elevation. Heating pad works well for back pain. Ibuprofen 400mg  (2 tablets 200mg ) every 5-6 hours for 3-5 days and then as needed for pain. Follow up with PCP if symptoms worsen or are persistent.   Emergency Department Resource Guide 1) Find a Doctor and Pay Out of Pocket Although you won't have to find out who is covered by your insurance plan, it is a good idea to ask around and get recommendations. You will then need to call the office and see if the doctor you have chosen will accept you as a new patient and what types of options they offer for patients who are self-pay. Some doctors offer discounts or will set up payment plans for their patients who do not have insurance, but you will need to ask so you aren't surprised when you get to your appointment.  2) Contact Your Local Health Department Not all health departments have doctors that can see patients for sick visits, but many do, so it is worth a call to see if yours does. If you don't know where your local health department is, you can check in your phone book. The CDC also has a tool to help you locate your state's health department, and many state websites also have listings of all of their local health departments.  3) Find a Walk-in Clinic If your illness is not likely to be very severe or complicated, you may want to try a walk in clinic. These are popping up all over the country in pharmacies, drugstores, and shopping centers. They're usually staffed by nurse practitioners or physician assistants that have been trained to treat common illnesses and complaints. They're usually fairly quick and inexpensive. However,  if you have serious medical issues or chronic medical problems, these are probably not your best option.  No Primary Care Doctor: - Call Health Connect at  [phone] - they can help you locate a primary care doctor that  accepts your insurance, provides certain services, etc. - Physician Referral Service- [phone]  Chronic Pain Problems: Organization         Address  Phone   Notes  [facility]  [phone] Patients need to be referred by their primary care doctor.   Medication Assistance: Organization         Address  Phone   Notes  [facility] [address] [place], [place] [address] [phone] --Must be a resident of [facility] -- Must have NO insurance coverage whatsoever (no Medicaid/ Medicare, etc.) -- The pt. MUST have a primary care doctor that directs their care regularly and follows them in the community   MedAssist  [phone]   [employer]  [phone]    Agencies that provide inexpensive medical care: Organization         Address  Phone   Notes  [facility]  [phone]   [facility]    [phone]   [facility] [address] [place], [place] [address] [phone]   [facility] [address], [place] [phone]   [facility]anned Parenthood    [phone]   [facility]    [phone]   [facility]  [address], [place] Phone:  [phone], Fax:  [phone] Hours of Operation:  9 am - 6 pm, M-F.  Also accepts Medicaid/Medicare and self-pay.  [facility]  [address], [place] Phone: [phone], Fax: [phone]. Hours of Operation:  8:30 am - 5:30 pm, M-F.  Also accepts Medicaid and self-pay.  [facility] [place] [address], [place] Phone: [phone]   [facility] [address] Natasha Bence [place], [place] [phone], Ext. [phone] Mondays & Thursdays: 7-9 AM.  First 15 patients are seen on a first come, first serve basis.    Medicaid-accepting [facility] Providers:  Organization         Address  Phone   Notes  [facility] [address], [place] [phone] Also accepts self-pay patients.  [facility] [address] Laurell Josephs [place], [place]  [phone]   [facility] [address], [place] [phone]   [facility] [address], [place] [phone]   Renaye Rakers [address], [place]   [phone] Only accepts [place] patients after they have their name applied to their card.   Self-Pay (no insurance) in [facility]:  Organization         Address  Phone   Notes  Sickle Cell Patients, [facility] [address] [place], [place] [phone]   [facility] [address] [place] [address] [place], [place] [phone]   [facility]  [address], [place] [phone]   [facility]/Dr. Osei-Bonsu  [address] [place] or [address], [place] [phone] Phone number for both [place] and [place] locations is the same.  [facility] [address], [place] [phone]   [facility] [address], [place] or [address] [phone]   [facility] [address] [place] [phone], phone; [phone], fax Sees patients 1st and 3rd Saturday of every month.  Must not qualify for public or private insurance (i.e. Medicaid, Medicare, [place] Health Choice, Veterans' Benefits)  Household income should be no more than 200% of the poverty level The clinic cannot treat you if you are pregnant or think you are  pregnant  Sexually transmitted diseases are not treated at the clinic.    Dental Care: Organization         Address  Phone  Notes  [facility] [address] [place], [place] [phone] Accepts children up to age 52 who are enrolled in [place] or [place] Health Choice; pregnant women with a Medicaid card; and children who have applied for Medicaid or [address] Health Choice, but were declined, whose parents can pay a reduced fee at time of service.  [facility] Department of [facility]  [address], [place] [phone] Accepts children up to age 95 who are enrolled in [place] or [place] Health Choice; pregnant women with a Medicaid card; and children who have applied for Medicaid or [place] Health Choice, but were declined, whose parents can pay a reduced  fee at time of service.  [facility]  [address], [place] [phone] Patients are seen by appointment only. Walk-ins are not accepted. [facility] will see patients 26 years of age and older. Monday - Tuesday (8am-5pm) Most Wednesdays (8:30-5pm) $30 per visit, cash only  [facility]  [address], [facility] [place] [phone] Patients are seen by appointment only. Walk-ins are not accepted. [facility] will see patients 26 years of age and older. One Wednesday Evening (Monthly: Volunteer Based).  $30 per visit, cash only  [facility]  [phone] for adults; Children under age 654, call Graduate Pediatric Dentistry at [phone]. Children aged 544-14, please call [phone] to request a pediatric application.  Dental services are provided in all areas of dental care including fillings, crowns and bridges, complete and partial dentures, implants, gum treatment, root canals, and extractions. Preventive care is also provided. Treatment is provided to both adults and  children. Patients are selected via a lottery and there is often a waiting list.   [facility] [address], [place]  [phone] [email or web address]   [facility] [address], [place], [place] [phone], Ext. [phone] Second and Fourth Thursday of each month, opens at 6:30 AM; Clinic ends at 9 AM.  Patients are seen on a first-come first-served basis, and a limited number are seen during each clinic.   [facility]  [address], [place], [place] [phone]) [phone]   Eligibility Requirements You must have lived in [place], [place], or [place] counties for at least the last three months.   You cannot be eligible for state or federal sponsored National Cityhealthcare insurance, including CIGNAVeterans Administration, [place]icaid, or [employer]Medicare.   You generally cannot be eligible for healthcare insurance through your employer.    How to apply: Eligibility screenings are held every Tuesday and Wednesday afternoon from 1:00 pm until 4:00 pm. You do not need an appointment for the interview!  [facility] [address], [place], [place] [phone]   [facility]  [phone]   [facility]  [phone]   [facility]  [phone]    Behavioral Health Resources in the Community: Intensive Outpatient Programs Organization         Address  Phone  Notes  [facility] [address], [place], [place] [phone]   [facility] [address], [place], [place] [phone]   [facility] [address], [place], [place]  [phone]   [facility] [address],  [place], [place] [phone] or [phone]   Substance Abuse Resources Organization         Address  Phone  Notes  [facility]  [phone]   [facility]  [phone]   [facility]  [phone]    [facility]  [phone]   Residential & Outpatient Substance Abuse Program  [phone]   Psychological Services Organization         Address  Phone  Notes  [facility]  [phone]- [phone]   [facility]  [phone]- [phone]   [facility] [address], [place] [phone] or [phone]    Mobile Crisis Teams Organization         Address  Phone  Notes  [facility], Mobile Crisis Care Unit  [phone]  [facility]  [address] [place], [place] [phone]   [facility] [address] [place] [phone]    Self-Help/Support Groups Organization         Address  Phone             Notes  [facility] [place] - variety of support groups  [phone]- [phone] Call for more information  Narcotics Anonymous (NA), [facility] [address], [employer] [place]  2 meetings at this location   Statistician         Address  Phone  Notes  [facility] [address],    [place]  [phone]   [facility]  [address] [place] [address], [place], [place] [phone]   [facility] [address] [place], [place] [phone] Admissions: 8am-3pm M-F  [facility] [address],    [place], [place] [phone]   [facility] [address] [place], [place], [place] [phone]   [facility] [address]  [place], [place] [phone]   [facility] - Intensive Outpatient [address]ure[address] Josephs [address], [place], [place] [phone]   [facility] ([facility] [address] [place]  [place], [place] [phone] or [phone]   [facility] ([facility]) [address] [place], [place] [phone] Accepts Medicaid  [facility] [address]  [place] [phone] Substance Abuse/Addiction Treatment   [facility] Organization         Address  Phone  Notes  [facility]  [phone]   Angie Fava, PhD [address] Ervin Knack [place], [place]   [phone] or [phone]   [facility]   [address] [place], [place] [phone]   [facility] [address], [place], [place] [phone] Insurance/Medicaid/sponsorship through [facility] [address]                                    [place], [place] [phone] Therapy/tele-psych/case  [facility] [address], [place] [phone]    Dr. Lolly Mustache  [phone]   [facility]  United Way [facility]. 1) [address]  2) [address], [place] 3)  [address], [place] [phone]  [phone]   [facility] Child Abuse Hotline 843-218-9896 or [phone] (After Hours)

## 2014-03-23 NOTE — ED Notes (Signed)
Pt seen by EDPA prior to RN assessment, see PA notes, orders received and initiated. Here for m/s back pain. Alert, NAD, calm. Steady gait. Given Rx x1.

## 2014-03-23 NOTE — ED Notes (Signed)
Pt in c/o upper back and neck pain, history of similar symptoms, pain is worse with movement, states he does a lot of heavy lifting at work

## 2014-03-23 NOTE — ED Notes (Signed)
Alert, NAD, calm, interactive, steady gait.  

## 2014-03-23 NOTE — ED Provider Notes (Signed)
Medical screening examination/treatment/procedure(s) were performed by non-physician practitioner and as supervising physician I was immediately available for consultation/collaboration.   EKG Interpretation None       Alejandro Adcox, MD 03/23/14 2350 

## 2014-03-23 NOTE — ED Provider Notes (Signed)
CSN: [id number]     Arrival date & time 03/23/14  1729 History  This chart was scribed for Nicholas ConroyVictoria Blaire Hodsdon, PA-C working with Arby BarretteMarcy Pfeiffer, MD by Nicholas Webster, ED Scribe. This patient was seen in room TR10C/TR10C and the patient's care was started at 8:04 PM.    Chief Complaint  Patient presents with  . Back Pain   The history is provided by the patient. No language interpreter was used.   HPI Comments: Nicholas Webster is a 28 y.o. male who presents to the Emergency Department complaining of sharp upper back pain onset 2 days ago. He states he is also having some neck pain. He states that he is continuously having to lift heavy objects when at work. He states that sleeping one the floor and the cold weather may be making his pain worse He states that he has taking benadryl with temporary relief. Denies fever,chills, night sweats, weight loss, numbness, weakness, bowel/bladder incontinence Denies Hx of cancer or IV drug use.    History reviewed. No pertinent past medical history. History reviewed. No pertinent past surgical history. History reviewed. No pertinent family history. History  Substance Use Topics  . Smoking status: Current Every Day Smoker -- 0.20 packs/day  . Smokeless tobacco: Not on file  . Alcohol Use: Yes     Comment: occassional    Review of Systems  Constitutional: Negative for fever.  Genitourinary: Negative.   Musculoskeletal: Positive for back pain and neck pain.  Neurological: Negative for weakness and numbness.  All other systems reviewed and are negative.     Allergies  Review of patient's allergies indicates no known allergies.  Home Medications   Prior to Admission medications   Medication Sig Start Date End Date Taking? Authorizing Provider  cephALEXin (KEFLEX) 250 MG capsule Take 1 capsule (250 mg total) by mouth 4 (four) times daily. 09/18/13   Antony MaduraKelly Humes, PA-C  cyclobenzaprine (FLEXERIL) 10 MG tablet Take 1 tablet (10 mg total) by mouth 2  (two) times daily as needed for muscle spasms. 01/21/14   Graylon GoodZachary H Baker, PA-C  cyclobenzaprine (FLEXERIL) 10 MG tablet Take 1 tablet (10 mg total) by mouth 2 (two) times daily as needed for muscle spasms. 03/23/14   Nicholas SjogrenVictoria L Kathern Lobosco, PA-C  ibuprofen (ADVIL,MOTRIN) 200 MG tablet Take 600 mg by mouth every 6 (six) hours as needed for pain.    Historical Provider, MD  ibuprofen (ADVIL,MOTRIN) 800 MG tablet Take 1 tablet (800 mg total) by mouth 3 (three) times daily. 01/21/14   Graylon GoodZachary H Baker, PA-C   Triage Vitals: BP 121/76  Pulse 82  Temp(Src) 98.2 F (36.8 C) (Oral)  Resp 20  SpO2 100%  Physical Exam  Nursing note and vitals reviewed. Constitutional: He appears well-developed and well-nourished. No distress.  HENT:  Head: Normocephalic and atraumatic.  Eyes: Conjunctivae are normal. Right eye exhibits no discharge. Left eye exhibits no discharge.  Neck: Normal range of motion. Neck supple.  Left sided neck pain in the distribution of the trapezius  Cardiovascular: Normal rate, regular rhythm and normal heart sounds.   2+ pulses equal bilaterally  Pulmonary/Chest: Effort normal and breath sounds normal. No respiratory distress. He has no wheezes.  Abdominal: Soft. Bowel sounds are normal. He exhibits no distension. There is no tenderness.  Musculoskeletal:  No midline back tenderness, step off or crepitus. Right Left sided lower back tenderness. No CVA tenderness.   Neurological: He is alert. Coordination normal.  Equal muscle tone. 5/5 strength in lower extremities.  DTR equal and intact. Negative straight leg test. Normal gait.   Skin: Skin is warm and dry. He is not diaphoretic.    ED Course  Procedures (including critical care time) DIAGNOSTIC STUDIES: Oxygen Saturation is 100% on RA, normla by my interpretation.    COORDINATION OF CARE: 8:28 PM-Discussed treatment plan which includes muscle relaxants and ibuprofen with pt at bedside and pt agreed to plan.     Labs  Review Labs Reviewed - No data to display  Imaging Review No results found.   EKG Interpretation None      MDM   Final diagnoses:  Neck pain on left side   Patient with neck pain. Patient denies back pain.no redflags for back pain. VSS. No neurological deficits and normal neuro exam. Upper extremity neurovascularly intact. Patient ambulatory in ED. No concern for cauda equina.  RICE protocol and pain medicine indicated and discussed with patient. Driving and sedation precautions provided. Patient is afebrile, nontoxic, and in no acute distress. Patient is appropriate for outpatient management and is stable for discharge.  Discussed return precautions with patient. Discussed all results and patient verbalizes understanding and agrees with plan.  I personally performed the services described in this documentation, which was scribed in my presence. The recorded information has been reviewed and is accurate.     Nicholas SjogrenVictoria L Carrell [place], PA-C 03/23/14 2043

## 2014-12-20 ENCOUNTER — Emergency Department (HOSPITAL_COMMUNITY): Payer: Self-pay

## 2014-12-20 ENCOUNTER — Emergency Department (HOSPITAL_COMMUNITY)
Admission: EM | Admit: 2014-12-20 | Discharge: 2014-12-20 | Disposition: A | Discharge status: Home / Self Care | Payer: Self-pay | Attending: Emergency Medicine | Admitting: Emergency Medicine

## 2014-12-20 ENCOUNTER — Encounter (HOSPITAL_COMMUNITY): Payer: Self-pay | Admitting: Emergency Medicine

## 2014-12-20 DIAGNOSIS — Z720 Tobacco use: Secondary | ICD-10-CM | POA: Insufficient documentation

## 2014-12-20 DIAGNOSIS — E8809 Other disorders of plasma-protein metabolism, not elsewhere classified: Secondary | ICD-10-CM | POA: Insufficient documentation

## 2014-12-20 DIAGNOSIS — Z792 Long term (current) use of antibiotics: Secondary | ICD-10-CM | POA: Insufficient documentation

## 2014-12-20 DIAGNOSIS — Z791 Long term (current) use of non-steroidal anti-inflammatories (NSAID): Secondary | ICD-10-CM | POA: Insufficient documentation

## 2014-12-20 DIAGNOSIS — M7989 Other specified soft tissue disorders: Secondary | ICD-10-CM | POA: Insufficient documentation

## 2014-12-20 LAB — URINALYSIS, ROUTINE W REFLEX MICROSCOPIC
Bilirubin Urine: NEGATIVE
Glucose, UA: NEGATIVE mg/dL
Hgb urine dipstick: NEGATIVE
Ketones, ur: NEGATIVE mg/dL
Leukocytes, UA: NEGATIVE
NITRITE: NEGATIVE
PROTEIN: NEGATIVE mg/dL
SPECIFIC GRAVITY, URINE: 1.014 (ref 1.005–1.030)
UROBILINOGEN UA: 0.2 mg/dL (ref 0.0–1.0)
pH: 7.5 (ref 5.0–8.0)

## 2014-12-20 LAB — CBC WITH DIFFERENTIAL/PLATELET
BASOS ABS: 0.1 10*3/uL (ref 0.0–0.1)
BASOS PCT: 1 % (ref 0–1)
EOS ABS: 0.2 10*3/uL (ref 0.0–0.7)
Eosinophils Relative: 2 % (ref 0–5)
HEMATOCRIT: 41.6 % (ref 39.0–52.0)
HEMOGLOBIN: 14 g/dL (ref 13.0–17.0)
Lymphocytes Relative: 39 % (ref 12–46)
Lymphs Abs: 2.7 10*3/uL (ref 0.7–4.0)
MCH: 28.6 pg (ref 26.0–34.0)
MCHC: 33.7 g/dL (ref 30.0–36.0)
MCV: 85.1 fL (ref 78.0–100.0)
MONO ABS: 0.6 10*3/uL (ref 0.1–1.0)
MONOS PCT: 9 % (ref 3–12)
NEUTROS ABS: 3.4 10*3/uL (ref 1.7–7.7)
NEUTROS PCT: 49 % (ref 43–77)
Platelets: 212 10*3/uL (ref 150–400)
RBC: 4.89 MIL/uL (ref 4.22–5.81)
RDW: 13.8 % (ref 11.5–15.5)
WBC: 6.9 10*3/uL (ref 4.0–10.5)

## 2014-12-20 LAB — COMPREHENSIVE METABOLIC PANEL
ALBUMIN: 2.8 g/dL — AB (ref 3.5–5.0)
ALT: 75 U/L — ABNORMAL HIGH (ref 17–63)
AST: 52 U/L — AB (ref 15–41)
Alkaline Phosphatase: 56 U/L (ref 38–126)
Anion gap: 5 (ref 5–15)
BUN: 7 mg/dL (ref 6–20)
CO2: 30 mmol/L (ref 22–32)
Calcium: 8.4 mg/dL — ABNORMAL LOW (ref 8.9–10.3)
Chloride: 104 mmol/L (ref 101–111)
Creatinine, Ser: 1.22 mg/dL (ref 0.61–1.24)
GFR calc Af Amer: >60 mL/min (ref >60)
GFR calc non Af Amer: >60 mL/min (ref >60)
GLUCOSE: 112 mg/dL — AB (ref 65–99)
POTASSIUM: 4.1 mmol/L (ref 3.5–5.1)
Sodium: 139 mmol/L (ref 135–145)
TOTAL PROTEIN: 5 g/dL — AB (ref 6.5–8.1)
Total Bilirubin: 0.6 mg/dL (ref 0.3–1.2)

## 2014-12-20 LAB — BRAIN NATRIURETIC PEPTIDE: B NATRIURETIC PEPTIDE 5: 48.9 pg/mL (ref 0.0–100.0)

## 2014-12-20 NOTE — Discharge Instructions (Signed)
Peripheral Edema °You have swelling in your legs (peripheral edema). This swelling is due to excess accumulation of salt and water in your body. Edema may be a sign of heart, kidney or liver disease, or a side effect of a medication. It may also be due to problems in the leg veins. Elevating your legs and using special support stockings may be very helpful, if the cause of the swelling is due to poor venous circulation. Avoid long periods of standing, whatever the cause. °Treatment of edema depends on identifying the cause. Chips, pretzels, pickles and other salty foods should be avoided. Restricting salt in your diet is almost always needed. Water pills (diuretics) are often used to remove the excess salt and water from your body via urine. These medicines prevent the kidney from reabsorbing sodium. This increases urine flow. °Diuretic treatment may also result in lowering of potassium levels in your body. Potassium supplements may be needed if you have to use diuretics daily. Daily weights can help you keep track of your progress in clearing your edema. You should call your caregiver for follow up care as recommended. °SEEK IMMEDIATE MEDICAL CARE IF:  °· You have increased swelling, pain, redness, or heat in your legs. °· You develop shortness of breath, especially when lying down. °· You develop chest or abdominal pain, weakness, or fainting. °· You have a fever. °Document Released: 06/22/2004 Document Revised: 08/07/2011 Document Reviewed: 06/02/2009 °ExitCare® Patient Information ©2015 [employer]. This information is not intended to replace advice given to you by your health care provider. Make sure you discuss any questions you have with your health care provider. ° °

## 2014-12-20 NOTE — ED Provider Notes (Signed)
CSN: [id number]     Arrival date & time 12/20/14  0919 History   First MD Initiated Contact with Patient 12/20/14 0930     Chief Complaint  Patient presents with  . Foot Swelling     (Consider location/radiation/quality/duration/timing/severity/associated sxs/prior Treatment) Patient is a 29 y.o. male presenting with leg pain.  Leg Pain Location:  Leg Time since incident:  2 days Leg location:  R lower leg and L lower leg Pain details:    Quality: no pain today, mild pain yesterday, swelling bilaterally.   Radiates to:  Does not radiate   Severity:  Mild   Onset quality:  Gradual   Duration:  2 days   Timing:  Constant   Progression:  Worsening Chronicity:  New Dislocation: no   Prior injury to area:  No Relieved by:  None tried Worsened by:  Nothing tried Ineffective treatments:  None tried Associated symptoms: swelling (bilateral)   Associated symptoms: no back pain, no decreased ROM, no fatigue, no fever, no neck pain, no numbness and no tingling   Risk factors: no recent illness     History reviewed. No pertinent past medical history. History reviewed. No pertinent past surgical history. History reviewed. No pertinent family history. History  Substance Use Topics  . Smoking status: Current Every Day Smoker -- 0.20 packs/day  . Smokeless tobacco: Not on file  . Alcohol Use: Yes     Comment: occassional    Review of Systems  Constitutional: Negative for fever and fatigue.  HENT: Negative for sore throat.   Eyes: Negative for visual disturbance.  Respiratory: Negative for shortness of breath.   Cardiovascular: Positive for leg swelling. Negative for chest pain.  Gastrointestinal: Negative for vomiting, abdominal pain (did yesterday, resolved), diarrhea and constipation.  Genitourinary: Negative for difficulty urinating.  Musculoskeletal: Negative for back pain, neck pain and neck stiffness.  Skin: Negative for rash.  Neurological: Negative for syncope and  headaches.      Allergies  Review of patient's allergies indicates no known allergies.  Home Medications   Prior to Admission medications   Medication Sig Start Date End Date Taking? Authorizing Provider  cephALEXin (KEFLEX) 250 MG capsule Take 1 capsule (250 mg total) by mouth 4 (four) times daily. 09/18/13   Antony Madura, PA-C  cyclobenzaprine (FLEXERIL) 10 MG tablet Take 1 tablet (10 mg total) by mouth 2 (two) times daily as needed for muscle spasms. 01/21/14   Graylon Good, PA-C  cyclobenzaprine (FLEXERIL) 10 MG tablet Take 1 tablet (10 mg total) by mouth 2 (two) times daily as needed for muscle spasms. 03/23/14   Oswaldo Conroy, PA-C  ibuprofen (ADVIL,MOTRIN) 200 MG tablet Take 600 mg by mouth every 6 (six) hours as needed for pain.    Historical Provider, MD  ibuprofen (ADVIL,MOTRIN) 800 MG tablet Take 1 tablet (800 mg total) by mouth 3 (three) times daily. 01/21/14   Adrian Blackwater Baker, PA-C   BP 122/71 mmHg  Pulse 77  Temp(Src) 97.9 F (36.6 C) (Oral)  Resp 20  Ht 6\' 2"  (1.88 m)  SpO2 99% Physical Exam  Constitutional: He is oriented to person, place, and time. He appears well-developed and well-nourished. No distress.  HENT:  Head: Normocephalic and atraumatic.  Eyes: Conjunctivae and EOM are normal.  Neck: Normal range of motion.  Cardiovascular: Normal rate, regular rhythm, normal heart sounds and intact distal pulses.  Exam reveals no gallop and no friction rub.   No murmur heard. Pulmonary/Chest: Effort normal and breath sounds  normal. No respiratory distress. He has no wheezes. He has no rales.  Abdominal: Soft. He exhibits no distension. There is no tenderness. There is no guarding.  Musculoskeletal: He exhibits edema (1+ bilaterally).  Neurological: He is alert and oriented to person, place, and time.  Skin: Skin is warm and dry. He is not diaphoretic.  Nursing note and vitals reviewed.   ED Course  Procedures (including critical care time) Labs Review Labs  Reviewed  CBC WITH DIFFERENTIAL/PLATELET  COMPREHENSIVE METABOLIC PANEL  URINALYSIS, ROUTINE W REFLEX MICROSCOPIC (NOT AT [facility])  BRAIN NATRIURETIC PEPTIDE    Imaging Review No results found.   EKG Interpretation None      MDM   Final diagnoses:  None   43 her old male in no severe medical history presents with concern of bilateral leg swelling for 2 days. If her initial diagnosis for leg swelling includes CHF, renal or liver abnormalities.  Ordered CMP which showed low albumin, very mildly elevated transaminases. Urinalysis to evaluate for proteinuria which showed no proteinuria. Ordered screening chest x-ray which showed no clear cardiomegaly.  Patient with symmetric swelling and have low suspicion for DVT. There is no sign of cellulitis or septic arthritis.  Patient does not have any dyspnea to suggest heart failure exacerbation. Plan will be for patient to follow-up with a primary care physician for further evaluation in setting  Of edema, low albumin, very mild transaminitis.  Social work evaluated patient to establish PCP.  Patient discharged in stable condition with understanding of reasons to return.    Alvira Monday, MD 12/20/14 2052

## 2014-12-20 NOTE — ED Notes (Addendum)
Patient provided water to encourage urine sample

## 2014-12-20 NOTE — Care Management Note (Signed)
Case Management Note  Patient Details  Name: AADIT HAGOOD MRN: [id number] Date of Birth: 1985-08-22  Subjective/Objective:       29 y.o. M seen in the ED for "swelling of the feet". Lives in Private residence with Uncle. Does not work. No PCP or Payor source.              Action/Plan: CM spoke with pt who confirms self pay [employer] resident.  CM discussed and provided written information for self pay pcps, discussed the importance of pcp vs EDP services for f/u care, www.needymeds.org, www.goodrx.com, discounted pharmacies and other [employer] such as [employer] , [employer], affordable care act,  [place] med assist, financial assistance, self pay dental services, [place] med assist, DSS and  health department  Reviewed resources for [employer] self pay pcps like [facility], family medicine at [employer], [facility], [facility], local urgent care centers, [facility], [facility] family practice, general medical clinics, family services of the [place], [facility] urgent care plus others, medication resources, [facility] out patient pharmacies and housing Pt voiced understanding and appreciation of resources provided.   Provided [facility] contact information Pt agreed to a referral Cm completed referral. Pt to be contact by [facility] clinical liaison.   Expected Discharge Date:                  Expected Discharge Plan:  Home/Self Care  In-House Referral:     Discharge planning Services  CM Consult, NA (No PCP; Referral to [facility])  Post Acute Care Choice:    Choice offered to:     DME Arranged:    DME Agency:     HH Arranged:    HH Agency:     Status of Service:  Completed, signed off  Medicare Important Message Given:    Date Medicare IM Given:    Medicare IM give by:    Date Additional Medicare IM Given:    Additional Medicare Important Message give by:     If discussed at Long Length of Stay Meetings, dates discussed:    Additional  Comments:  Yvone Neu, RN 12/20/2014, 10:30 AM

## 2014-12-20 NOTE — ED Notes (Signed)
Pt with bilateral foot swelling x several days; pt denies hx of same; pt denies other complaint or pain

## 2014-12-20 NOTE — Care Management Note (Signed)
Case Management Note  Patient Details  Name: Nicholas Webster MRN: [id number] Date of Birth: [birth year]  Subjective/Objective:                    Action/Plan:   Expected Discharge Date:                  Expected Discharge Plan:  Home/Self Care  In-House Referral:     Discharge planning Services  CM Consult, NA (No PCP; Referral to [facility])  Post Acute Care Choice:    Choice offered to:     DME Arranged:    DME Agency:     HH Arranged:    HH Agency:     Status of Service:  Completed, signed off  Medicare Important Message Given:    Date Medicare IM Given:    Medicare IM give by:    Date Additional Medicare IM Given:    Additional Medicare Important Message give by:     If discussed at Long Length of Stay Meetings, dates discussed:    Additional Comments:  Yvone Neu, RN 12/20/2014, 10:39 AM

## 2014-12-20 NOTE — ED Notes (Signed)
Patient transported to X-ray 

## 2015-02-10 ENCOUNTER — Encounter (HOSPITAL_COMMUNITY): Payer: Self-pay | Admitting: *Deleted

## 2015-02-10 ENCOUNTER — Emergency Department (HOSPITAL_COMMUNITY): Payer: Self-pay

## 2015-02-10 ENCOUNTER — Emergency Department (HOSPITAL_COMMUNITY)
Admission: EM | Admit: 2015-02-10 | Discharge: 2015-02-11 | Disposition: A | Discharge status: Home / Self Care | Payer: Self-pay | Attending: Emergency Medicine | Admitting: Emergency Medicine

## 2015-02-10 DIAGNOSIS — F1210 Cannabis abuse, uncomplicated: Secondary | ICD-10-CM | POA: Insufficient documentation

## 2015-02-10 DIAGNOSIS — R55 Syncope and collapse: Secondary | ICD-10-CM | POA: Insufficient documentation

## 2015-02-10 DIAGNOSIS — Z87891 Personal history of nicotine dependence: Secondary | ICD-10-CM | POA: Insufficient documentation

## 2015-02-10 LAB — CBC
HCT: 43.5 % (ref 39.0–52.0)
HEMOGLOBIN: 14.4 g/dL (ref 13.0–17.0)
MCH: 27.5 pg (ref 26.0–34.0)
MCHC: 33.1 g/dL (ref 30.0–36.0)
MCV: 83 fL (ref 78.0–100.0)
Platelets: 176 10*3/uL (ref 150–400)
RBC: 5.24 MIL/uL (ref 4.22–5.81)
RDW: 12.7 % (ref 11.5–15.5)
WBC: 8.3 10*3/uL (ref 4.0–10.5)

## 2015-02-10 LAB — URINALYSIS, ROUTINE W REFLEX MICROSCOPIC
Bilirubin Urine: NEGATIVE
Glucose, UA: NEGATIVE mg/dL
HGB URINE DIPSTICK: NEGATIVE
Ketones, ur: 15 mg/dL — AB
Leukocytes, UA: NEGATIVE
Nitrite: NEGATIVE
Protein, ur: NEGATIVE mg/dL
SPECIFIC GRAVITY, URINE: 1.029 (ref 1.005–1.030)
Urobilinogen, UA: 0.2 mg/dL (ref 0.0–1.0)
pH: 6 (ref 5.0–8.0)

## 2015-02-10 LAB — RAPID URINE DRUG SCREEN, HOSP PERFORMED
Amphetamines: NOT DETECTED
BARBITURATES: NOT DETECTED
BENZODIAZEPINES: NOT DETECTED
COCAINE: NOT DETECTED
Opiates: NOT DETECTED
TETRAHYDROCANNABINOL: POSITIVE — AB

## 2015-02-10 LAB — BASIC METABOLIC PANEL
ANION GAP: 6 (ref 5–15)
BUN: 13 mg/dL (ref 6–20)
CALCIUM: 8.4 mg/dL — AB (ref 8.9–10.3)
CO2: 27 mmol/L (ref 22–32)
Chloride: 103 mmol/L (ref 101–111)
Creatinine, Ser: 1.39 mg/dL — ABNORMAL HIGH (ref 0.61–1.24)
GFR calc Af Amer: >60 mL/min (ref >60)
GFR calc non Af Amer: >60 mL/min (ref >60)
GLUCOSE: 109 mg/dL — AB (ref 65–99)
Potassium: 3.9 mmol/L (ref 3.5–5.1)
Sodium: 136 mmol/L (ref 135–145)

## 2015-02-10 LAB — ETHANOL: Alcohol, Ethyl (B): <5 mg/dL (ref <5)

## 2015-02-10 LAB — CBG MONITORING, ED: Glucose-Capillary: 102 mg/dL — ABNORMAL HIGH (ref 65–99)

## 2015-02-10 MED ORDER — SODIUM CHLORIDE 0.9 % IV BOLUS (SEPSIS)
1000.0000 mL | Freq: Once | INTRAVENOUS | Status: AC
  Administered 2015-02-10: 1000 mL via INTRAVENOUS

## 2015-02-10 NOTE — ED Provider Notes (Signed)
CSN: [id number]     Arrival date & time 02/10/15  1926 History  This chart was scribed for Nicholas Crumble, MD by Lyndel Safe, ED Scribe. This patient was seen in room D32C/D32C and the patient's care was started 11:10 PM.   Chief Complaint  Patient presents with  . Loss of Consciousness   The history is provided by the patient and a significant other. No language interpreter was used.   HPI Comments: Nicholas Webster is a 29 y.o. male, with no pertinent PMhx, who presents to the Emergency Department complaining of an episode of syncope PTA. Pt reports he began to feel light-headed while driving when he pulled over and 'blacked out'. He reports upon waking up he felt hot and was diaphoretic with mild blurry vision which has resolved. His significant other reports during the 2 minute episode of syncope the pt's BUE were shaking and his eyes were rolling back in his head but she was able to tap him and wake him up. She states when he woke up his eyes were still rolling back in his head but he was not confused and able to return to baseline. Pt reports a recent URI. Denies incontinence of bowel or bladder, history of seizures, recent dehydration, biting his tongue during the incident, new prescriptions or recent dosage changes or EtOH or drug use tonight. Girl friend denies pt's mouth tightening up during the incident.    History reviewed. No pertinent past medical history. History reviewed. No pertinent past surgical history. No family history on file. Social History  Substance Use Topics  . Smoking status: Former Smoker -- 0.20 packs/day  . Smokeless tobacco: Never Used  . Alcohol Use: Yes     Comment: occassional    Review of Systems 10 Systems reviewed and are negative for acute change except as noted in the HPI.  Allergies  Review of patient's allergies indicates no known allergies.  Home Medications   Prior to Admission medications   Medication Sig Start Date End Date Taking?  Authorizing Provider  ibuprofen (ADVIL,MOTRIN) 200 MG tablet Take 600 mg by mouth every 6 (six) hours as needed for pain.   Yes Historical Provider, MD   BP 113/70 mmHg  Pulse 65  Temp(Src) 97.4 F (36.3 C) (Oral)  Resp 16  Ht  (1.88 m)  Wt 189 lb 8 oz (85.957 kg)  BMI 24.32 kg/m2  SpO2 100% Physical Exam  Constitutional: He is oriented to person, place, and time. Vital signs are normal. He appears well-developed and well-nourished.  Non-toxic appearance. He does not appear ill. No distress.  HENT:  Head: Normocephalic and atraumatic.  Nose: Nose normal.  Mouth/Throat: Oropharynx is clear and moist. No oropharyngeal exudate.  Eyes: Conjunctivae and EOM are normal. Pupils are equal, round, and reactive to light. No scleral icterus.  Neck: Normal range of motion. Neck supple. No tracheal deviation, no edema, no erythema and normal range of motion present. No thyroid mass and no thyromegaly present.  Cardiovascular: Normal rate, regular rhythm, S1 normal, S2 normal, normal heart sounds, intact distal pulses and normal pulses.  Exam reveals no gallop and no friction rub.   No murmur heard. Pulses:      Radial pulses are 2+ on the right side, and 2+ on the left side.       Dorsalis pedis pulses are 2+ on the right side, and 2+ on the left side.  Pulmonary/Chest: Effort normal and breath sounds normal. No respiratory distress. He has no  wheezes. He has no rhonchi. He has no rales.  Abdominal: Soft. Normal appearance and bowel sounds are normal. He exhibits no distension, no ascites and no mass. There is no hepatosplenomegaly. There is no tenderness. There is no rebound, no guarding and no CVA tenderness.  Musculoskeletal: Normal range of motion. He exhibits no edema or tenderness.  Lymphadenopathy:    He has no cervical adenopathy.  Neurological: He is alert and oriented to person, place, and time. He has normal strength. No cranial nerve deficit or sensory deficit.  Normal strength and  sensation in all extremities; normal cerebellar testing.   Skin: Skin is warm, dry and intact. No petechiae and no rash noted. He is not diaphoretic. No erythema. No pallor.  Psychiatric: He has a normal mood and affect. His behavior is normal. Judgment normal.  Nursing note and vitals reviewed.   ED Course  Procedures  DIAGNOSTIC STUDIES: Oxygen Saturation is 100% on RA, normal by my interpretation.    COORDINATION OF CARE: 11:15 PM Discussed treatment plan which includes to order a head CT with pt. Pt acknowledges and agrees to plan.   Labs Review Labs Reviewed  BASIC METABOLIC PANEL - Abnormal; Notable for the following:    Glucose, Bld 109 (*)    Creatinine, Ser 1.39 (*)    Calcium 8.4 (*)    All other components within normal limits  URINALYSIS, ROUTINE W REFLEX MICROSCOPIC (NOT AT [facility]) - Abnormal; Notable for the following:    Ketones, ur 15 (*)    All other components within normal limits  URINE RAPID DRUG SCREEN, HOSP PERFORMED - Abnormal; Notable for the following:    Tetrahydrocannabinol POSITIVE (*)    All other components within normal limits  CBG MONITORING, ED - Abnormal; Notable for the following:    Glucose-Capillary 102 (*)    All other components within normal limits  CBC  ETHANOL    Imaging Review Dg Chest 2 View  02/11/2015   CLINICAL DATA:  29 year old male with seizure and syncope  EXAM: CHEST  2 VIEW  COMPARISON:  Radiograph dated 12/20/2014  FINDINGS: The heart size and mediastinal contours are within normal limits. Both lungs are clear. The visualized skeletal structures are unremarkable.  IMPRESSION: No active cardiopulmonary disease.   Electronically Signed   By: Elgie Collard M.D.   On: 02/11/2015 00:07   Ct Head Wo Contrast  02/11/2015   CLINICAL DATA:  Syncope versus new onset seizure today.  EXAM: CT HEAD WITHOUT CONTRAST  TECHNIQUE: Contiguous axial images were obtained from the base of the skull through the vertex without intravenous  contrast.  COMPARISON:  12/01/2007  FINDINGS: No intracranial hemorrhage, mass effect, or midline shift. No hydrocephalus. The basilar cisterns are patent. No evidence of territorial infarct. No intracranial fluid collection. Calvarium is intact. Included paranasal sinuses and mastoid air cells are well aerated.  IMPRESSION: No acute intracranial abnormality.   Electronically Signed   By: Rubye Oaks M.D.   On: 02/11/2015 00:33   I have personally reviewed and evaluated these images and lab results as part of my medical decision-making.   EKG Interpretation   Date/Time:  Wednesday February 10 2015 19:38:48 EDT Ventricular Rate:  73 PR Interval:  130 QRS Duration: 92 QT Interval:  378 QTC Calculation: 416 R Axis:   66 Text Interpretation:  Normal sinus rhythm Normal ECG No significant change  since last tracing Confirmed by Erroll Luna [phone]) on 02/10/2015  10:54:55 PM  MDM   Final diagnoses:  None   Patient presents emergency department for LOC. Is difficult to ascertain from the history of this was seizure or syncope. Girlfriend describes upper extremity shaking and mild confusion afterwards. However did not sound like a postictal state. In addition there is no incontinence, no tongue biting, no history of seizures in the past. I feel this is likely a syncopal episode. Will obtain CT scan of the head in case this was a seizure. Education was provided, primary care follow-up is advised. Laboratory studies are unremarkable, EKG does not show any cause for syncope.  CT head and cxr are negative.  He otherwise appears well and in no acute distress, his vital signs remain within his normal limits and he is safe for discharge.   I personally performed the services described in this documentation, which was scribed in my presence. The recorded information has been reviewed and is accurate.    Nicholas Crumble, MD 02/11/15 [phone]

## 2015-02-10 NOTE — ED Notes (Signed)
Patient and SO stated he "passed out" 2 times today where his eyes rolled back in his head and was shaking.  Stated he was driving and felt faint and pulled over.  SO stated she had to "smack" his face to wake him up

## 2015-02-11 NOTE — ED Notes (Signed)
Patient left at this time with all belongings. 

## 2015-02-11 NOTE — Discharge Instructions (Signed)
Syncope Mr. Pelly, your work up including CT scan and chest xray were normal.  You likely had a passing out episode, we do not think this was a seizure.  See a primary care doctor within 3 days for close follow up.  If symptoms worsen, come back to the ED immediately.  Thank you. Syncope means a person passes out (faints). The person usually wakes up in less than 5 minutes. It is important to seek medical care for syncope. HOME CARE  Have someone stay with you until you feel normal.  Do not drive, use machines, or play sports until your doctor says it is okay.  Keep all doctor visits as told.  Lie down when you feel like you might pass out. Take deep breaths. Wait until you feel normal before standing up.  Drink enough fluids to keep your pee (urine) clear or pale yellow.  If you take blood pressure or heart medicine, get up slowly. Take several minutes to sit and then stand. GET HELP RIGHT AWAY IF:   You have a severe headache.  You have pain in the chest, belly (abdomen), or back.  You are bleeding from the mouth or butt (rectum).  You have black or tarry poop (stool).  You have an irregular or very fast heartbeat.  You have pain with breathing.  You keep passing out, or you have shaking (seizures) when you pass out.  You pass out when sitting or lying down.  You feel confused.  You have trouble walking.  You have severe weakness.  You have vision problems. If you fainted, call your local emergency services (911 in [place] Do not drive yourself to the hospital. MAKE SURE YOU:   Understand these instructions.  Will watch your condition.  Will get help right away if you are not doing well or get worse. Document Released: 11/01/2007 Document Revised: 11/14/2011 Document Reviewed: 07/14/2011 [facility] Patient Information 2015 [place], [name]. This information is not intended to replace advice given to you by your health care provider. Make sure you discuss any questions  you have with your health care provider. Seizure, Adult A seizure means there is unusual activity in the brain. A seizure can cause changes in attention or behavior. Seizures often cause shaking (convulsions). Seizures often last from 30 seconds to 2 minutes. HOME CARE   If you are given medicines, take them exactly as told by your doctor.  Keep all doctor visits as told.  Do not swim or drive until your doctor says it is okay.  Teach others what to do if you have a seizure. They should:  Lay you on the ground.  Put a cushion under your head.  Loosen any tight clothing around your neck.  Turn you on your side.  Stay with you until you get better. GET HELP RIGHT AWAY IF:   The seizure lasts longer than 2 to 5 minutes.  The seizure is very bad.  The person does not wake up after the seizure.  The person's attention or behavior changes. Drive the person to the emergency room or call your local emergency services (911 in [place] MAKE SURE YOU:   Understand these instructions.  Will watch your condition.  Will get help right away if you are not doing well or get worse. Document Released: 11/01/2007 Document Revised: 08/07/2011 Document Reviewed: 05/03/2011 [facility] Patient Information 2015 [place], [name]. This information is not intended to replace advice given to you by your health care provider. Make sure you discuss any  questions you have with your health care provider. ° °

## 2015-07-22 ENCOUNTER — Emergency Department (HOSPITAL_COMMUNITY)
Admission: EM | Admit: 2015-07-22 | Discharge: 2015-07-22 | Disposition: A | Discharge status: Home / Self Care | Payer: Self-pay | Attending: Emergency Medicine | Admitting: Emergency Medicine

## 2015-07-22 ENCOUNTER — Encounter (HOSPITAL_COMMUNITY): Payer: Self-pay | Admitting: *Deleted

## 2015-07-22 DIAGNOSIS — Z87891 Personal history of nicotine dependence: Secondary | ICD-10-CM | POA: Insufficient documentation

## 2015-07-22 DIAGNOSIS — Z202 Contact with and (suspected) exposure to infections with a predominantly sexual mode of transmission: Secondary | ICD-10-CM | POA: Insufficient documentation

## 2015-07-22 LAB — URINALYSIS, ROUTINE W REFLEX MICROSCOPIC
Bilirubin Urine: NEGATIVE
Glucose, UA: NEGATIVE mg/dL
Hgb urine dipstick: NEGATIVE
Ketones, ur: NEGATIVE mg/dL
LEUKOCYTES UA: NEGATIVE
NITRITE: NEGATIVE
PROTEIN: NEGATIVE mg/dL
SPECIFIC GRAVITY, URINE: 1.029 (ref 1.005–1.030)
pH: 6 (ref 5.0–8.0)

## 2015-07-22 MED ORDER — CEFTRIAXONE SODIUM 250 MG IJ SOLR
250.0000 mg | Freq: Once | INTRAMUSCULAR | Status: AC
  Administered 2015-07-22: 250 mg via INTRAMUSCULAR
  Filled 2015-07-22: qty 250

## 2015-07-22 MED ORDER — STERILE WATER FOR INJECTION IJ SOLN
INTRAMUSCULAR | Status: AC
Start: 2015-07-22 — End: 2015-07-22
  Administered 2015-07-22: 10 mL
  Filled 2015-07-22: qty 10

## 2015-07-22 MED ORDER — AZITHROMYCIN 250 MG PO TABS
1000.0000 mg | ORAL_TABLET | Freq: Once | ORAL | Status: AC
  Administered 2015-07-22: 1000 mg via ORAL
  Filled 2015-07-22: qty 4

## 2015-07-22 MED ORDER — METRONIDAZOLE 500 MG PO TABS
2000.0000 mg | ORAL_TABLET | Freq: Once | ORAL | Status: AC
  Administered 2015-07-22: 2000 mg via ORAL
  Filled 2015-07-22: qty 4

## 2015-07-22 MED ORDER — ONDANSETRON 4 MG PO TBDP
4.0000 mg | ORAL_TABLET | Freq: Once | ORAL | Status: AC
  Administered 2015-07-22: 4 mg via ORAL
  Filled 2015-07-22: qty 1

## 2015-07-22 NOTE — ED Notes (Signed)
Pt states he was told by his ex that she has trich.  Denies s/s.

## 2015-07-22 NOTE — ED Provider Notes (Signed)
CSN: [id number]     Arrival date & time 07/22/15  0909 History  By signing my name below, I, Nicholas Webster, attest that this documentation has been prepared under the direction and in the presence of Danelle Berry, PA-C Electronically Signed: Charline Bills, ED Scribe 07/22/2015 at 11:56 AM.   Chief Complaint  Patient presents with  . Exposure to STD   The history is provided by the patient. No language interpreter was used.   HPI Comments: Nicholas Webster is a 30 y.o. male who presents to the Emergency Department complaining of exposure to STD. Pt states that his ex-girlfriend told him yesterday that she was diagnosed with trichomoniasis. He reports that he last had sexual intercourse with her 1 week ago. Pt denies dysuria, penile discharge, testicular pain, scrotal swelling, inguinal adenopathy, arthralgias and any other symptoms at this time.   History reviewed. No pertinent past medical history. History reviewed. No pertinent past surgical history. No family history on file. Social History  Substance Use Topics  . Smoking status: Former Smoker -- 0.20 packs/day  . Smokeless tobacco: Never Used  . Alcohol Use: Yes     Comment: occassional    Review of Systems  All other systems reviewed and are negative.  Allergies  Review of patient's allergies indicates no known allergies.  Home Medications   Prior to Admission medications   Medication Sig Start Date End Date Taking? Authorizing Provider  ibuprofen (ADVIL,MOTRIN) 200 MG tablet Take 600 mg by mouth every 6 (six) hours as needed for pain.    Historical Provider, MD   BP 123/72 mmHg  Pulse 86  Temp(Src) 98.4 F (36.9 C) (Oral)  Resp 20  SpO2 100% Physical Exam  Constitutional: He is oriented to person, place, and time. He appears well-developed and well-nourished. No distress.  HENT:  Head: Normocephalic and atraumatic.  Eyes: Conjunctivae and EOM are normal.  Neck: Neck supple. No tracheal deviation present.   Cardiovascular: Normal rate.   Pulmonary/Chest: Effort normal. No respiratory distress.  Genitourinary: Testes normal. Right testis shows no mass, no swelling and no tenderness. Left testis shows no mass, no swelling and no tenderness. Circumcised. No penile erythema. No discharge found.  Chaperon present. No rash noted.  Musculoskeletal: Normal range of motion.  Lymphadenopathy:       Right: No inguinal adenopathy present.       Left: No inguinal adenopathy present.  Neurological: He is alert and oriented to person, place, and time.  Skin: Skin is warm and dry. No rash noted.  Psychiatric: He has a normal mood and affect. His behavior is normal.  Nursing note and vitals reviewed.  ED Course  Procedures (including critical care time) DIAGNOSTIC STUDIES: Oxygen Saturation is 100% on RA, normal by my interpretation.    COORDINATION OF CARE: 11:23 AM-Discussed treatment plan which includes STD screening, UA, Zithromax, rocephin, flagyl and zofran with pt at bedside and pt agreed to plan.   Labs Review Labs Reviewed  URINALYSIS, ROUTINE W REFLEX MICROSCOPIC (NOT AT [facility]) - Abnormal; Notable for the following:    Color, Urine AMBER (*)    All other components within normal limits  GC/CHLAMYDIA PROBE AMP ([facility]) NOT AT [facility]   Imaging Review No results found. I have personally reviewed and evaluated these images and lab results as part of my medical decision-making.   EKG Interpretation None      MDM   Pt presents to the ER with concerns of STD exposure, he is asymptomatic, exam  normal, pt is healthy and well appearing.   Pt treated with azithromycin, rocephin and 2g metronidazole (because he was specifically told he had exposure to trich.)  He was discharged in good condition.  Return precautions reviewed.  STD information provided.  Final diagnoses:  Exposure to STD   I personally performed the services described in this documentation, which was scribed in my  presence. The recorded information has been reviewed and is accurate.     Danelle Berry, PA-C 07/22/15 1936  Donnetta Hutching, MD 07/24/15 [phone]

## 2015-07-22 NOTE — Discharge Instructions (Signed)
Safe Sex °Safe sex is about reducing the risk of giving or getting a sexually transmitted disease (STD). STDs are spread through sexual contact involving the genitals, mouth, or rectum. Some STDs can be cured and others cannot. Safe sex can also prevent unintended pregnancies.  °WHAT ARE SOME SAFE SEX PRACTICES? °· Limit your sexual activity to only one partner who is having sex with only you. °· Talk to your partner about his or her past partners, past STDs, and drug use. °· Use a condom every time you have sexual intercourse. This includes vaginal, oral, and anal sexual activity. Both females and males should wear condoms during oral sex. Only use latex or polyurethane condoms and water-based lubricants. Using petroleum-based lubricants or oils to lubricate a condom will weaken the condom and increase the chance that it will break. The condom should be in place from the beginning to the end of sexual activity. Wearing a condom reduces, but does not completely eliminate, your risk of getting or giving an STD. STDs can be spread by contact with infected body fluids and skin. °· Get vaccinated for hepatitis B and HPV. °· Avoid alcohol and recreational drugs, which can affect your judgment. You may forget to use a condom or participate in high-risk sex. °· For females, avoid douching after sexual intercourse. Douching can spread an infection farther into the reproductive tract. °· Check your body for signs of sores, blisters, rashes, or unusual discharge. See your health care provider if you notice any of these signs. °· Avoid sexual contact if you have symptoms of an infection or are being treated for an STD. If you or your partner has herpes, avoid sexual contact when blisters are present. Use condoms at all other times. °· If you are at risk of being infected with HIV, it is recommended that you take a prescription medicine daily to prevent HIV infection. This is called pre-exposure prophylaxis (PrEP). You are  considered at risk if: °· You are a man who has sex with other men (MSM). °· You are a heterosexual man or woman who is sexually active with more than one partner. °· You take drugs by injection. °· You are sexually active with a partner who has HIV. °· Talk with your health care provider about whether you are at high risk of being infected with HIV. If you choose to begin PrEP, you should first be tested for HIV. You should then be tested every 3 months for as long as you are taking PrEP. °· See your health care provider for regular screenings, exams, and tests for other STDs. Before having sex with a new partner, each of you should be screened for STDs and should talk about the results with each other. °WHAT ARE THE BENEFITS OF SAFE SEX?  °· There is less chance of getting or giving an STD. °· You can prevent unwanted or unintended pregnancies. °· By discussing safe sex concerns with your partner, you may increase feelings of intimacy, comfort, trust, and honesty between the two of you. °  °This information is not intended to replace advice given to you by your health care provider. Make sure you discuss any questions you have with your health care provider. °  °Document Released: 06/22/2004 Document Revised: 06/05/2014 Document Reviewed: 11/06/2011 °Elsevier Interactive Patient Education ©2016 Elsevier Inc. ° °Sexually Transmitted Disease °A sexually transmitted disease (STD) is a disease or infection that may be passed (transmitted) from person to person, usually during sexual activity. This may happen by   way of saliva, semen, blood, vaginal mucus, or urine. Common STDs include: °· Gonorrhea. °· Chlamydia. °· Syphilis. °· HIV and AIDS. °· Genital herpes. °· Hepatitis B and C. °· Trichomonas. °· Human papillomavirus (HPV). °· Pubic lice. °· Scabies. °· Mites. °· Bacterial vaginosis. °WHAT ARE CAUSES OF STDs? °An STD may be caused by bacteria, a virus, or parasites. STDs are often transmitted during sexual  activity if one person is infected. However, they may also be transmitted through nonsexual means. STDs may be transmitted after:  °· Sexual intercourse with an infected person. °· Sharing sex toys with an infected person. °· Sharing needles with an infected person or using unclean piercing or tattoo needles. °· Having intimate contact with the genitals, mouth, or rectal areas of an infected person. °· Exposure to infected fluids during birth. °WHAT ARE THE SIGNS AND SYMPTOMS OF STDs? °Different STDs have different symptoms. Some people may not have any symptoms. If symptoms are present, they may include: °· Painful or bloody urination. °· Pain in the pelvis, abdomen, vagina, anus, throat, or eyes. °· A skin rash, itching, or irritation. °· Growths, ulcerations, blisters, or sores in the genital and anal areas. °· Abnormal vaginal discharge with or without bad odor. °· Penile discharge in men. °· Fever. °· Pain or bleeding during sexual intercourse. °· Swollen glands in the groin area. °· Yellow skin and eyes (jaundice). This is seen with hepatitis. °· Swollen testicles. °· Infertility. °· Sores and blisters in the mouth. °HOW ARE STDs DIAGNOSED? °To make a diagnosis, your health care provider may: °· Take a medical history. °· Perform a physical exam. °· Take a sample of any discharge to examine. °· Swab the throat, cervix, opening to the penis, rectum, or vagina for testing. °· Test a sample of your first morning urine. °· Perform blood tests. °· Perform a Pap test, if this applies. °· Perform a colposcopy. °· Perform a laparoscopy. °HOW ARE STDs TREATED? °Treatment depends on the STD. Some STDs may be treated but not cured. °· Chlamydia, gonorrhea, trichomonas, and syphilis can be cured with antibiotic medicine. °· Genital herpes, hepatitis, and HIV can be treated, but not cured, with prescribed medicines. The medicines lessen symptoms. °· Genital warts from HPV can be treated with medicine or by freezing,  burning (electrocautery), or surgery. Warts may come back. °· HPV cannot be cured with medicine or surgery. However, abnormal areas may be removed from the cervix, vagina, or vulva. °· If your diagnosis is confirmed, your recent sexual partners need treatment. This is true even if they are symptom-free or have a negative culture or evaluation. They should not have sex until their health care providers say it is okay. °· Your health care provider may test you for infection again 3 months after treatment. °HOW CAN I REDUCE MY RISK OF GETTING AN STD? °Take these steps to reduce your risk of getting an STD: °· Use latex condoms, dental dams, and water-soluble lubricants during sexual activity. Do not use petroleum jelly or oils. °· Avoid having multiple sex partners. °· Do not have sex with someone who has other sex partners °· Do not have sex with anyone you do not know or who is at high risk for an STD. °· Avoid risky sex practices that can break your skin. °· Do not have sex if you have open sores on your mouth or skin. °· Avoid drinking too much alcohol or taking illegal drugs. Alcohol and drugs can affect your judgment and put   you in a vulnerable position.  Avoid engaging in oral and anal sex acts.  Get vaccinated for HPV and hepatitis. If you have not received these vaccines in the past, talk to your health care provider about whether one or both might be right for you.  If you are at risk of being infected with HIV, it is recommended that you take a prescription medicine daily to prevent HIV infection. This is called pre-exposure prophylaxis (PrEP). You are considered at risk if:  You are a man who has sex with other men (MSM).  You are a heterosexual man or woman and are sexually active with more than one partner.  You take drugs by injection.  You are sexually active with a partner who has HIV.  Talk with your health care provider about whether you are at high risk of being infected with HIV. If  you choose to begin PrEP, you should first be tested for HIV. You should then be tested every 3 months for as long as you are taking PrEP. WHAT SHOULD I DO IF I THINK I HAVE AN STD?  See your health care provider.  Tell your sexual partner(s). They should be tested and treated for any STDs.  Do not have sex until your health care provider says it is okay. WHEN SHOULD I GET IMMEDIATE MEDICAL CARE? Contact your health care provider right away if:   You have severe abdominal pain.  You are a man and notice swelling or pain in your testicles.  You are a woman and notice swelling or pain in your vagina.   This information is not intended to replace advice given to you by your health care provider. Make sure you discuss any questions you have with your health care provider.   Document Released: 08/05/2002 Document Revised: 06/05/2014 Document Reviewed: 12/03/2012 Elsevier Interactive Patient Education 2016 [employer].  Trichomoniasis Trichomoniasis is an infection caused by an organism called Trichomonas. The infection can affect both women and men. In women, the outer male genitalia and the vagina are affected. In men, the penis is mainly affected, but the prostate and other reproductive organs can also be involved. Trichomoniasis is a sexually transmitted infection (STI) and is most often passed to another person through sexual contact.  RISK FACTORS  Having unprotected sexual intercourse.  Having sexual intercourse with an infected partner. SIGNS AND SYMPTOMS  Symptoms of trichomoniasis in women include:  Abnormal gray-green frothy vaginal discharge.  Itching and irritation of the vagina.  Itching and irritation of the area outside the vagina. Symptoms of trichomoniasis in men include:   Penile discharge with or without pain.  Pain during urination. This results from inflammation of the urethra. DIAGNOSIS  Trichomoniasis may be found during a Pap test or physical exam.  Your health care provider may use one of the following methods to help diagnose this infection:  Testing the pH of the vagina with a test tape.  Using a vaginal swab test that checks for the Trichomonas organism. A test is available that provides results within a few minutes.  Examining a urine sample.  Testing vaginal secretions. Your health care provider may test you for other STIs, including HIV. TREATMENT   You may be given medicine to fight the infection. Women should inform their health care provider if they could be or are pregnant. Some medicines used to treat the infection should not be taken during pregnancy.  Your health care provider may recommend over-the-counter medicines or creams to decrease itching  or irritation.  Your sexual partner will need to be treated if infected.  Your health care provider may test you for infection again 3 months after treatment. HOME CARE INSTRUCTIONS   Take medicines only as directed by your health care provider.  Take over-the-counter medicine for itching or irritation as directed by your health care provider.  Do not have sexual intercourse while you have the infection.  Women should not douche or wear tampons while they have the infection.  Discuss your infection with your partner. Your partner may have gotten the infection from you, or you may have gotten it from your partner.  Have your sex partner get examined and treated if necessary.  Practice safe, informed, and protected sex.  See your health care provider for other STI testing. SEEK MEDICAL CARE IF:   You still have symptoms after you finish your medicine.  You develop abdominal pain.  You have pain when you urinate.  You have bleeding after sexual intercourse.  You develop a rash.  Your medicine makes you sick or makes you throw up (vomit). MAKE SURE YOU:  Understand these instructions.  Will watch your condition.  Will get help right away if you are not  doing well or get worse.   This information is not intended to replace advice given to you by your health care provider. Make sure you discuss any questions you have with your health care provider.   Document Released: 11/08/2000 Document Revised: 06/05/2014 Document Reviewed: 02/24/2013 Elsevier Interactive Patient Education [employer]2016 Elsevier Inc.

## 2015-07-23 LAB — GC/CHLAMYDIA PROBE AMP (~~LOC~~) NOT AT ARMC
Chlamydia: NEGATIVE
NEISSERIA GONORRHEA: NEGATIVE

## 2017-03-05 IMAGING — CT CT HEAD W/O CM
2 series · 16 of 30 positions shown, 18 images · non-contrast
Comparison: 12/01/2007

CLINICAL DATA: Syncope versus new onset seizure today.

EXAM:
CT HEAD WITHOUT CONTRAST
TECHNIQUE: Contiguous axial images were obtained from the base of the skull
through the vertex without intravenous contrast.

[Series 201: head w/o, idose (1) · axial · non-contrast · 0.44mm/px · z∈[+104,+214]mm · 8 of 30 slices shown, 10 images]
[im 4/30  brain]
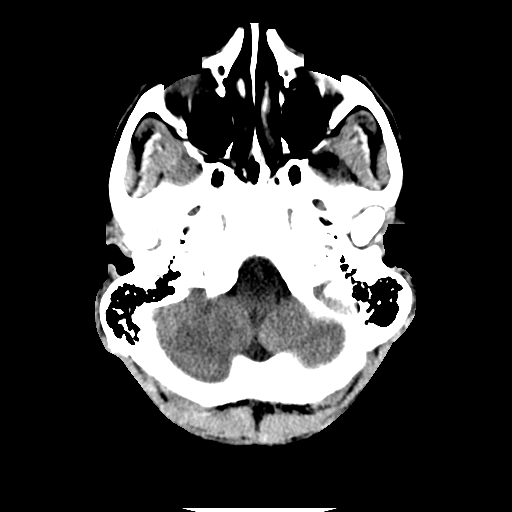
[im 4/30  bone]
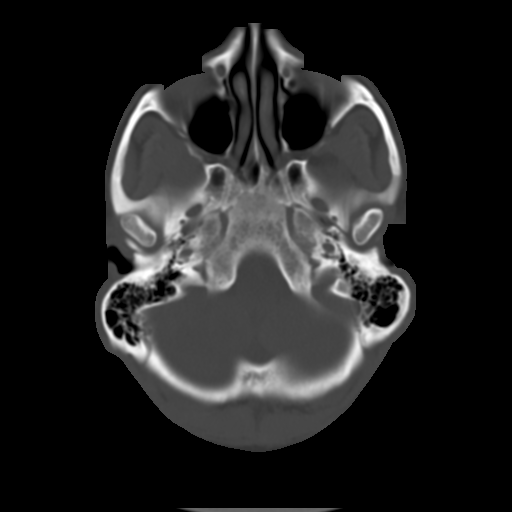
[im 7/30  brain]
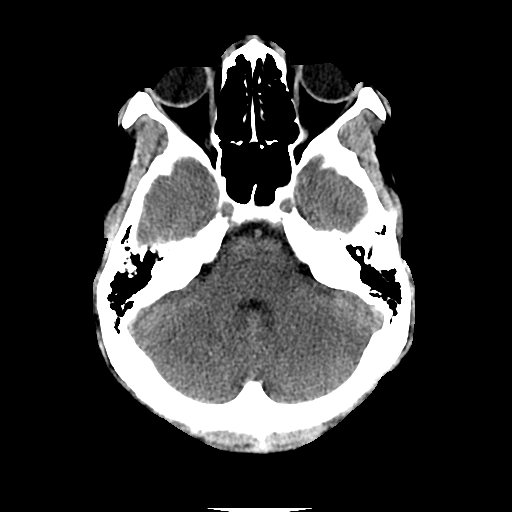
[im 10/30  brain]
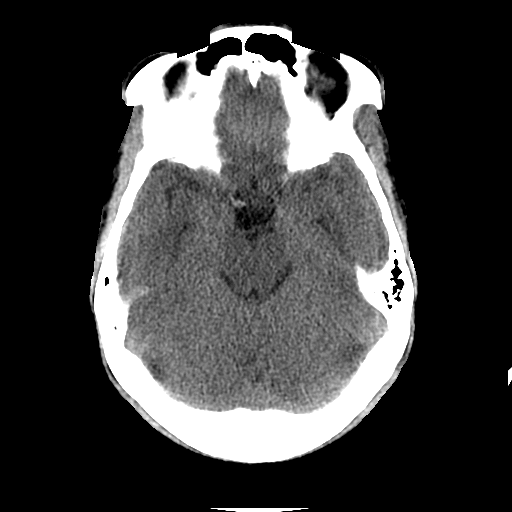
[im 13/30  brain]
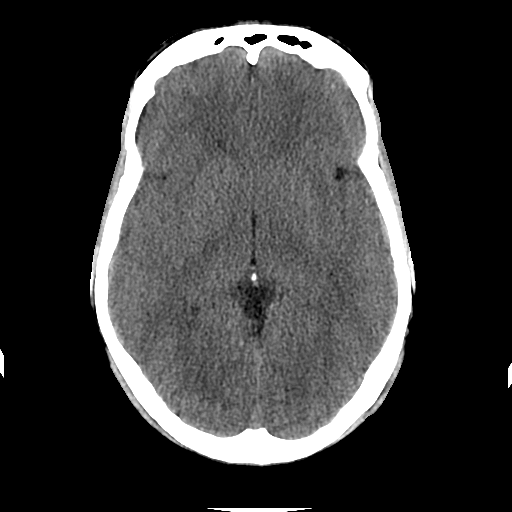
[im 17/30  brain]
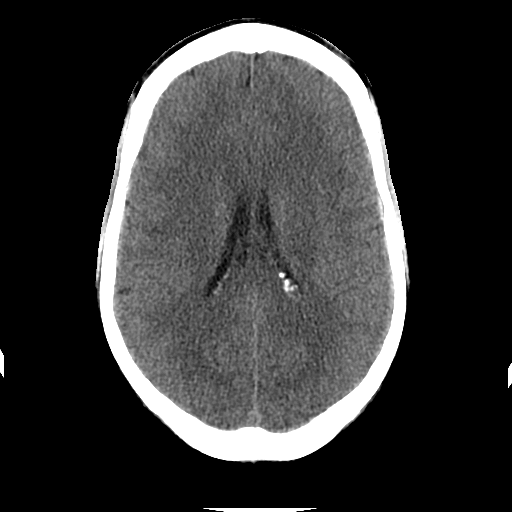
[im 17/30  bone]
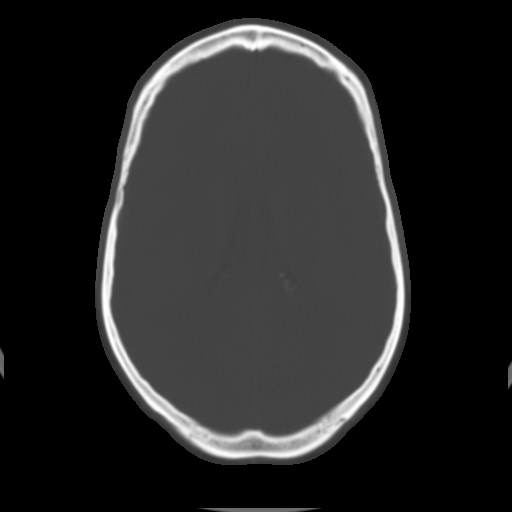
[im 20/30  brain]
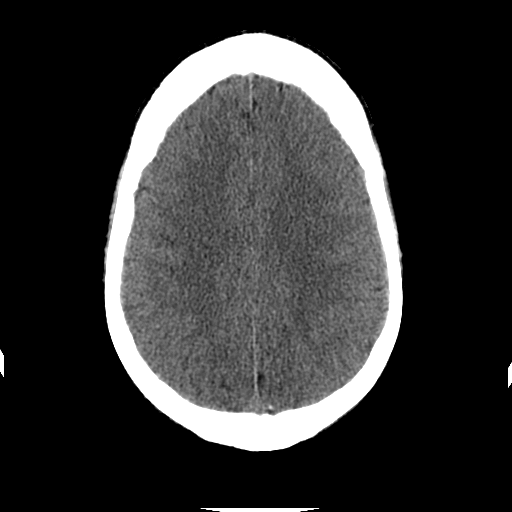
[im 23/30  brain]
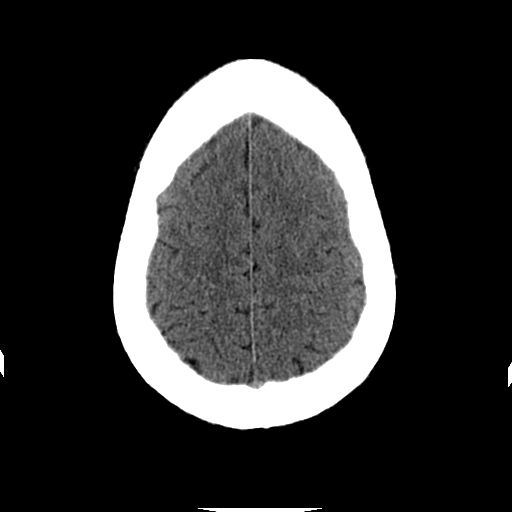
[im 26/30  brain]
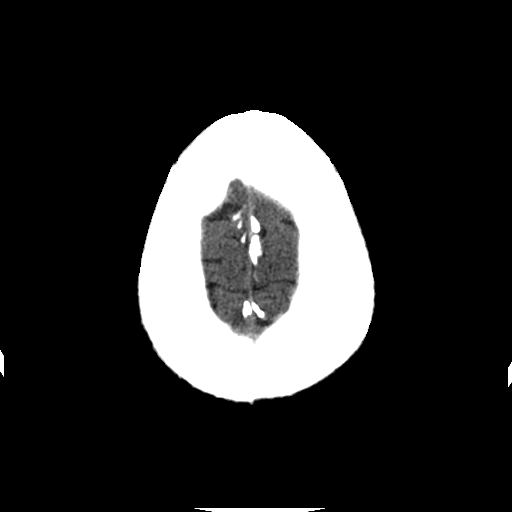

[Series 202: head w/o bone, idose (1) · axial · non-contrast · 0.44mm/px · z∈[+103,+218]mm · 8 of 60 slices shown]
[im 7/60  bone]
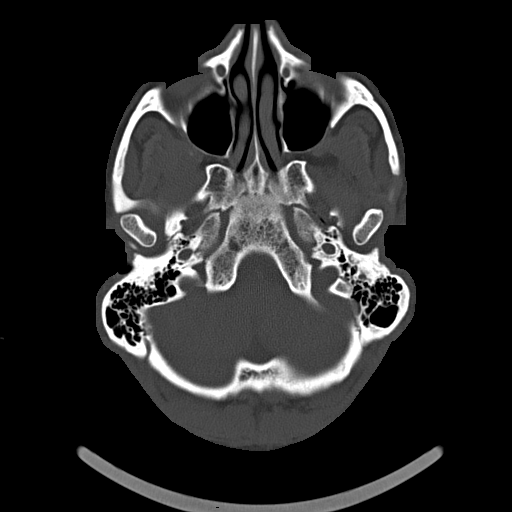
[im 13/60  bone]
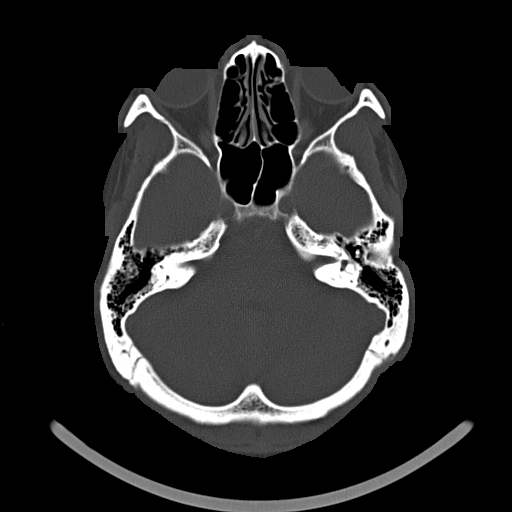
[im 19/60  bone]
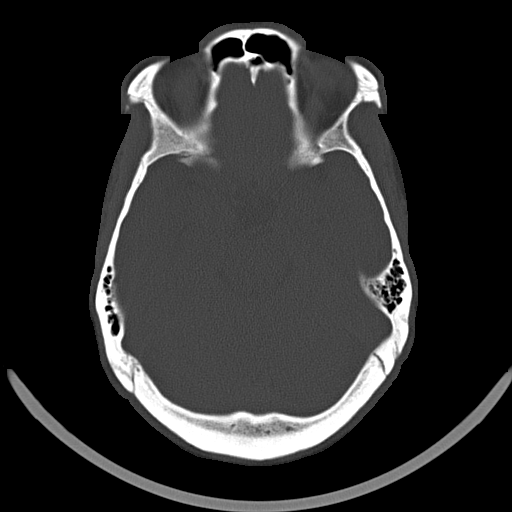
[im 25/60  bone]
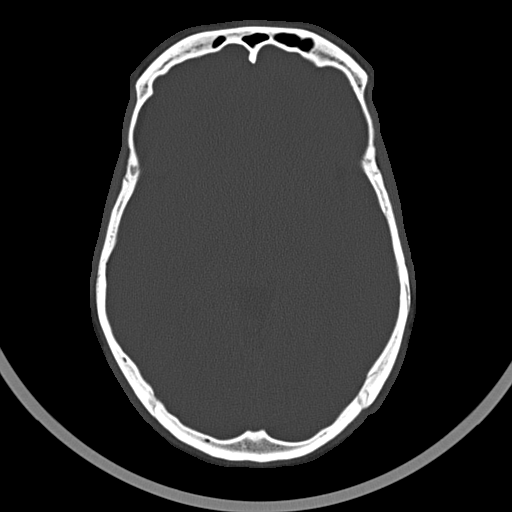
[im 35/60  bone]
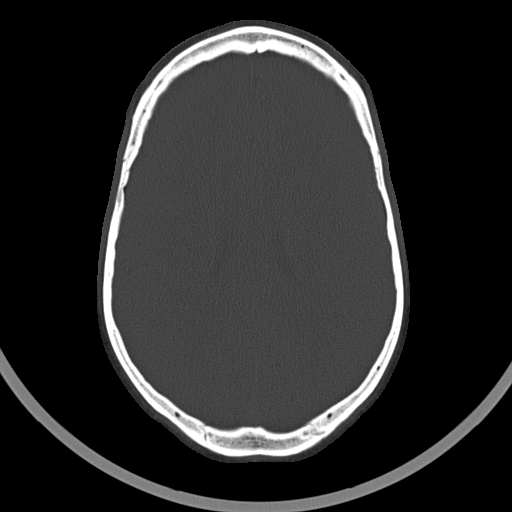
[im 41/60  bone]
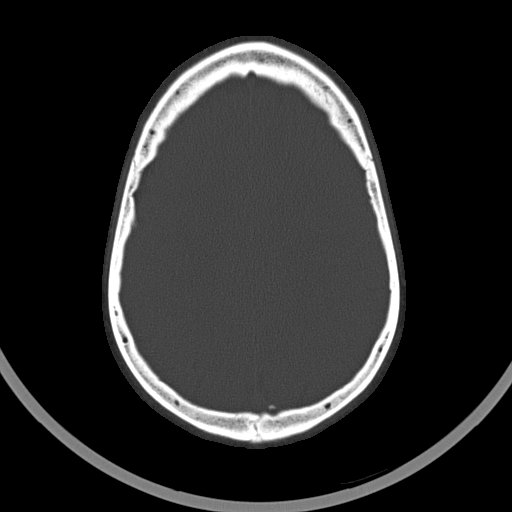
[im 47/60  bone]
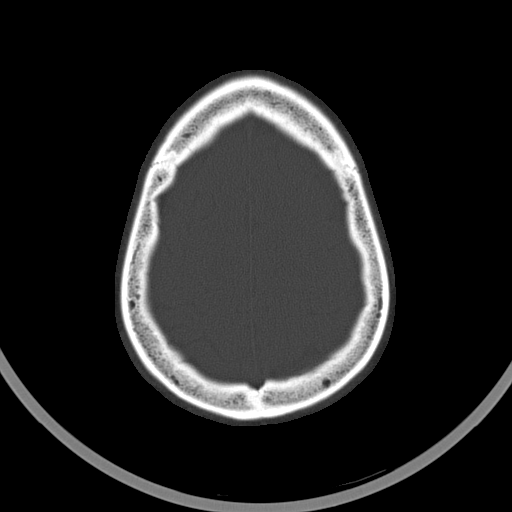
[im 53/60  bone]
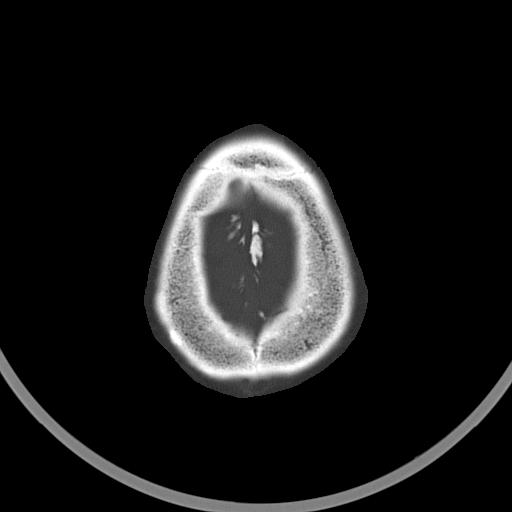

[16 of 30 positions shown; findings below may reference images not displayed]

FINDINGS: No intracranial hemorrhage, mass effect, or midline shift. No
hydrocephalus. The basilar cisterns are patent. No evidence of
territorial infarct. No intracranial fluid collection. Calvarium is
intact. Included paranasal sinuses and mastoid air cells are well
aerated.
IMPRESSION: No acute intracranial abnormality.

## 2018-01-04 ENCOUNTER — Other Ambulatory Visit: Payer: Self-pay

## 2018-01-04 ENCOUNTER — Encounter (HOSPITAL_COMMUNITY): Payer: Self-pay | Admitting: Emergency Medicine

## 2018-01-04 ENCOUNTER — Emergency Department (HOSPITAL_COMMUNITY)
Admission: EM | Admit: 2018-01-04 | Discharge: 2018-01-04 | Disposition: A | Discharge status: Home / Self Care | Payer: Self-pay | Attending: Emergency Medicine | Admitting: Emergency Medicine

## 2018-01-04 DIAGNOSIS — Z87891 Personal history of nicotine dependence: Secondary | ICD-10-CM | POA: Insufficient documentation

## 2018-01-04 DIAGNOSIS — M79641 Pain in right hand: Secondary | ICD-10-CM | POA: Insufficient documentation

## 2018-01-04 DIAGNOSIS — M79642 Pain in left hand: Secondary | ICD-10-CM | POA: Insufficient documentation

## 2018-01-04 NOTE — ED Triage Notes (Signed)
Pt c/o pain to right hand x's 1 week.  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

## 2018-01-04 NOTE — Discharge Instructions (Signed)
Wear braces while at work Take Ibuprofen for pain every 6-8 hours as needed Ice the hands after work to reduce inflammation Please follow up with a primary doctor

## 2018-01-04 NOTE — ED Provider Notes (Signed)
[facility] EMERGENCY DEPARTMENT Provider Note   CSN: [id number] Arrival date & time: 01/04/18  1710     History   Chief Complaint Chief Complaint  Patient presents with  . Hand Pain    HPI Nicholas Webster is a 32 y.o. male who presents with bilateral hand pain.  No significant past medical history.  The patient states over the past week he has had bilateral hand pain over his fingers which is worse over his thumbs  His right is worse than his left hand.  He is right-handed.  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  He has tried Tylenol and Advil with good relief however this is temporary.  He is never had this problem before.  He denies trauma to the area.  He denies any swelling.  He has a ring on his finger has been able to take it off without any difficulty.  No numbness or tingling.  The pain is not severe just feels like a soreness.  He is having difficulty when he grips things and turns the key in the lock and his hands feel mildly weak.  HPI  History reviewed. No pertinent past medical history.  There are no active problems to display for this patient.   History reviewed. No pertinent surgical history.      Home Medications    Prior to Admission medications   Medication Sig Start Date End Date Taking? Authorizing Provider  ibuprofen (ADVIL,MOTRIN) 200 MG tablet Take 600 mg by mouth every 6 (six) hours as needed for pain.    [provider]    Family History No family history on file.  Social History Social History   Tobacco Use  . Smoking status: Former Smoker    Packs/day: 0.20  . Smokeless tobacco: Never Used  Substance Use Topics  . Alcohol use: Yes    Comment: occassional  . Drug use: Yes    Types: Marijuana     Allergies   Patient has no known allergies.   Review of Systems Review of Systems  Musculoskeletal: Positive for arthralgias.  Skin: Negative for wound.      Physical Exam Updated Vital Signs BP 119/72 (BP Location: Right Arm)   Pulse 63   Temp 98.1 F (36.7 C) (Oral)   Resp 16   Ht 6' 2.5" (1.892 m)   Wt 86.2 kg   SpO2 100%   BMI 24.07 kg/m   Physical Exam  Constitutional: He is oriented to person, place, and time. He appears well-developed and well-nourished. No distress.  HENT:  Head: Normocephalic and atraumatic.  Eyes: Pupils are equal, round, and reactive to light. Conjunctivae are normal. Right eye exhibits no discharge. Left eye exhibits no discharge. No scleral icterus.  Neck: Normal range of motion.  Cardiovascular: Normal rate.  Pulmonary/Chest: Effort normal. No respiratory distress.  Abdominal: He exhibits no distension.  Musculoskeletal:  Right hand: No obvious swelling, deformity, or warmth. No tenderness. Pain is elicited with wrist extension. FROM of all fingers. 5/5 grip strength. N/V intact.  Left hand: No obvious swelling, deformity, or warmth. No tenderness. FROM of fingers and wrist. 5/5 grip strength. N/V intact.  Neurological: He is alert and oriented to person, place, and time.  Skin: Skin is warm and dry.  Psychiatric: He has a normal mood and affect. His behavior is normal.  Nursing note and vitals reviewed.    ED Treatments / Results  Labs (all labs ordered are listed, but only abnormal results are displayed) Labs Reviewed - No data to display  EKG None  Radiology No results found.  Procedures Procedures (including critical care time)  Medications Ordered in ED Medications - No data to display   Initial Impression / Assessment and Plan / ED Course  I have reviewed the triage vital signs and the nursing notes.  Pertinent labs & imaging results that were available during my care of the patient were reviewed by me and considered in my medical decision making (see chart for details).  32 year old male presents with bilateral hand soreness and weakness for the past week.  Symptoms are  consistent with overuse injury.  He has no tenderness has full range of motion of his fingers.  He is neurovascularly intact and has normal grip strength.  He will be given a wrist brace and advised rest, ice, NSAIDs and to follow-up with a primary provider.  Final Clinical Impressions(s) / ED Diagnoses   Final diagnoses:  Left hand pain  Right hand pain    ED Discharge Orders    None       Bethel BornGekas, Aurther Harlin Marie, PA-C 01/04/18 1832    Marily MemosMesner, Jason, MD 01/05/18 2302

## 2018-01-08 DIAGNOSIS — G43A0 Cyclical vomiting, not intractable: Secondary | ICD-10-CM | POA: Insufficient documentation

## 2018-01-08 DIAGNOSIS — Z87891 Personal history of nicotine dependence: Secondary | ICD-10-CM | POA: Insufficient documentation

## 2018-01-09 ENCOUNTER — Emergency Department (HOSPITAL_COMMUNITY)
Admission: EM | Admit: 2018-01-09 | Discharge: 2018-01-09 | Disposition: A | Discharge status: Home / Self Care | Payer: Self-pay | Attending: Emergency Medicine | Admitting: Emergency Medicine

## 2018-01-09 ENCOUNTER — Encounter (HOSPITAL_COMMUNITY): Payer: Self-pay | Admitting: Emergency Medicine

## 2018-01-09 DIAGNOSIS — R112 Nausea with vomiting, unspecified: Secondary | ICD-10-CM

## 2018-01-09 LAB — URINALYSIS, ROUTINE W REFLEX MICROSCOPIC
BILIRUBIN URINE: NEGATIVE
Glucose, UA: NEGATIVE mg/dL
Hgb urine dipstick: NEGATIVE
Ketones, ur: NEGATIVE mg/dL
Leukocytes, UA: NEGATIVE
NITRITE: NEGATIVE
PH: 6 (ref 5.0–8.0)
Protein, ur: NEGATIVE mg/dL
SPECIFIC GRAVITY, URINE: 1.021 (ref 1.005–1.030)

## 2018-01-09 LAB — COMPREHENSIVE METABOLIC PANEL
ALBUMIN: 3.4 g/dL — AB (ref 3.5–5.0)
ALT: 28 U/L (ref 0–44)
ANION GAP: 7 (ref 5–15)
AST: 33 U/L (ref 15–41)
Alkaline Phosphatase: 63 U/L (ref 38–126)
BUN: 12 mg/dL (ref 6–20)
CO2: 26 mmol/L (ref 22–32)
Calcium: 8.7 mg/dL — ABNORMAL LOW (ref 8.9–10.3)
Chloride: 106 mmol/L (ref 98–111)
Creatinine, Ser: 1.3 mg/dL — ABNORMAL HIGH (ref 0.61–1.24)
GFR calc non Af Amer: >60 mL/min (ref >60)
Glucose, Bld: 102 mg/dL — ABNORMAL HIGH (ref 70–99)
POTASSIUM: 4.1 mmol/L (ref 3.5–5.1)
SODIUM: 139 mmol/L (ref 135–145)
TOTAL PROTEIN: 5.6 g/dL — AB (ref 6.5–8.1)
Total Bilirubin: 0.6 mg/dL (ref 0.3–1.2)

## 2018-01-09 LAB — CBC
HEMATOCRIT: 44.4 % (ref 39.0–52.0)
HEMOGLOBIN: 14 g/dL (ref 13.0–17.0)
MCH: 27.1 pg (ref 26.0–34.0)
MCHC: 31.5 g/dL (ref 30.0–36.0)
MCV: 86 fL (ref 78.0–100.0)
Platelets: 225 10*3/uL (ref 150–400)
RBC: 5.16 MIL/uL (ref 4.22–5.81)
RDW: 14 % (ref 11.5–15.5)
WBC: 9.3 10*3/uL (ref 4.0–10.5)

## 2018-01-09 LAB — LIPASE, BLOOD: Lipase: 36 U/L (ref 11–51)

## 2018-01-09 MED ORDER — ONDANSETRON 4 MG PO TBDP: 4.0000 mg | ORAL_TABLET | Freq: Three times a day (TID) | ORAL | 0 refills | Status: DC | PRN

## 2018-01-09 NOTE — ED Provider Notes (Signed)
[facility]   CSN: [id number] Arrival date & time: 01/08/18  2358     History   Chief Complaint Chief Complaint  Patient presents with  . Emesis    HPI Nicholas Webster is a 32 y.o. male.  The history is provided by the patient and medical records.  Emesis      32 y.o. M here with nausea/vomiting.  Patient states he ate fried shrimp tonight-- states it was frozen from [employer].  States it tasted a little funny-- kind of chewy and rubbery.  States about 2-3 hours after dinner he started to feel nauseated and and vomited numerous times.  Denies abdominal pain or diarrhea.  Wife ate similar and she vomited as well.  No fever/chills/sweats.  States he had to call out of work so was told to come in to be evaluated.  Last emesis about 4 hours ago, has drank water since then without issue.  History reviewed. No pertinent past medical history.  There are no active problems to display for this patient.   Past Surgical History:  Procedure Laterality Date  . WRIST SURGERY Left         Home Medications    Prior to Admission medications   Not on File    Family History No family history on file.  Social History Social History   Tobacco Use  . Smoking status: Former Smoker    Packs/day: 0.20  . Smokeless tobacco: Never Used  Substance Use Topics  . Alcohol use: Yes    Comment: Socially  . Drug use: Yes    Types: Marijuana     Allergies   Patient has no known allergies.   Review of Systems Review of Systems  Gastrointestinal: Positive for nausea and vomiting.  All other systems reviewed and are negative.    Physical Exam Updated Vital Signs BP (!) 144/79 (BP Location: Right Arm)   Pulse 77   Temp 98 F (36.7 C) (Oral)   Resp 16   Ht 6' 2.5" (1.892 m)   Wt 86.2 kg   SpO2 99%   BMI 24.07 kg/m   Physical Exam  Constitutional: He is oriented to person, place, and time. He appears well-developed  and well-nourished.  HENT:  Head: Normocephalic and atraumatic.  Mouth/Throat: Oropharynx is clear and moist.  Eyes: Pupils are equal, round, and reactive to light. Conjunctivae and EOM are normal.  Neck: Normal range of motion.  Cardiovascular: Normal rate, regular rhythm and normal heart sounds.  Pulmonary/Chest: Effort normal and breath sounds normal. No stridor. No respiratory distress.  Abdominal: Soft. Bowel sounds are normal. There is no tenderness. There is no rebound.  Soft, non-tender, normal bowel sounds  Musculoskeletal: Normal range of motion.  Neurological: He is alert and oriented to person, place, and time.  Skin: Skin is warm and dry.  Psychiatric: He has a normal mood and affect.  Nursing note and vitals reviewed.    ED Treatments / Results  Labs (all labs ordered are listed, but only abnormal results are displayed) Labs Reviewed  COMPREHENSIVE METABOLIC PANEL - Abnormal; Notable for the following components:      Result Value   Glucose, Bld 102 (*)    Creatinine, Ser 1.30 (*)    Calcium 8.7 (*)    Total Protein 5.6 (*)    Albumin 3.4 (*)    All other components within normal limits  LIPASE, BLOOD  CBC  URINALYSIS, ROUTINE W REFLEX MICROSCOPIC  EKG None  Radiology No results found.  Procedures Procedures (including critical care time)  Medications Ordered in ED Medications - No data to display   Initial Impression / Assessment and Plan / ED Course  I have reviewed the triage vital signs and the nursing notes.  Pertinent labs & imaging results that were available during my care of the patient were reviewed by me and considered in my medical decision making (see chart for details).  32 year old male who with nausea and vomiting after eating fried shrimp.  States it was frozen strep from [employer], did taste a little bit funny.  Began vomiting about 2 to 3 hours later.  Last emesis about 4 hours ago, has drank water since then without issue.  He  is afebrile and nontoxic.  Abdomen is soft and benign.  Screening labs obtained from triage and are overall reassuring.  Slight increase in serum creatinine, appears to be his baseline based on prior values.  He does not appear clinically dehydrated.  Suspect this was food related.  Will send home with PRN Zofran if needed, continue oral hydration.  He can follow-up with PCP.  Discussed plan with patient, he acknowledged understanding and agreed with plan of care.  Return precautions given for new or worsening symptoms.  Final Clinical Impressions(s) / ED Diagnoses   Final diagnoses:  Non-intractable vomiting with nausea, unspecified vomiting type    ED Discharge Orders         Ordered    ondansetron (ZOFRAN ODT) 4 MG disintegrating tablet  Every 8 hours PRN     01/09/18 0312           Garlon HatchetSanders, Mikaili Flippin M, PA-C 01/09/18 0341    Nira Connardama, Pedro Eduardo, MD 01/09/18 [phone]

## 2018-01-09 NOTE — ED Notes (Signed)
ED Provider at bedside. 

## 2018-01-09 NOTE — Discharge Instructions (Signed)
Take the prescribed medication as directed.  Make sure to drink lots of fluids. °Follow-up with your primary care doctor. °Return to the ED for new or worsening symptoms. °

## 2018-01-09 NOTE — ED Triage Notes (Signed)
Pt reports emesis all morning after eating shrimp. Denies abd pain, denies diarrhea.

## 2019-08-04 ENCOUNTER — Ambulatory Visit (HOSPITAL_COMMUNITY)
Admission: EM | Admit: 2019-08-04 | Discharge: 2019-08-04 | Disposition: A | Discharge status: Home / Self Care | Payer: Self-pay | Attending: Family Medicine | Admitting: Family Medicine

## 2019-08-04 ENCOUNTER — Other Ambulatory Visit: Payer: Self-pay

## 2019-08-04 ENCOUNTER — Encounter (HOSPITAL_COMMUNITY): Payer: Self-pay | Admitting: Emergency Medicine

## 2019-08-04 DIAGNOSIS — B350 Tinea barbae and tinea capitis: Secondary | ICD-10-CM

## 2019-08-04 DIAGNOSIS — S0502XA Injury of conjunctiva and corneal abrasion without foreign body, left eye, initial encounter: Secondary | ICD-10-CM

## 2019-08-04 MED ORDER — FLUCONAZOLE 150 MG PO TABS: 150.0000 mg | ORAL_TABLET | ORAL | 0 refills | Status: DC

## 2019-08-04 MED ORDER — TETRACAINE HCL 0.5 % OP SOLN
OPHTHALMIC | Status: AC
  Filled 2019-08-04: qty 4

## 2019-08-04 MED ORDER — ERYTHROMYCIN 5 MG/GM OP OINT: TOPICAL_OINTMENT | OPHTHALMIC | 0 refills | Status: DC

## 2019-08-04 MED ORDER — FLUORESCEIN SODIUM 1 MG OP STRP
ORAL_STRIP | OPHTHALMIC | Status: AC
  Filled 2019-08-04: qty 1

## 2019-08-04 NOTE — ED Triage Notes (Signed)
Pt here for left eye irritation after shaving his head and then states when he rubbed his eye in the shower he started having pain in left eye with redness, irritation and drainage

## 2019-08-04 NOTE — Discharge Instructions (Signed)
Treating you for a corneal abrasion.  Use the antibiotic ointment as prescribed. Fluconazole for the ringworm of the scalp.  Take this weekly x4 weeks. Follow up as needed for continued or worsening symptoms

## 2019-08-05 NOTE — ED Provider Notes (Signed)
[facility]    CSN: [id number] Arrival date & time: 08/04/19  1415      History   Chief Complaint Chief Complaint  Patient presents with  . Eye Pain    HPI Nicholas Webster is a 34 y.o. male.   Pt is a 34 year old male that presents with left eye discomfort. This has been present since yesterday. Started after shaving his head and feels like he may have gotten something in the eye. Tried to flush the eye and use OTC eye drops. Still has the sensation of something in the eye. Mild irritation, redness and photophobia. Some exudate when he woke up this am. No lid swelling.   He also has spot to the scalp that he is concerned about. This has been there for a month or so. He shaved his hair due to bald spot. He has been using OTC fungal cream. No specific itching or drainage from the area.   ROS per HPI      History reviewed. No pertinent past medical history.  There are no problems to display for this patient.   Past Surgical History:  Procedure Laterality Date  . WRIST SURGERY Left        Home Medications    Prior to Admission medications   Medication Sig Start Date End Date Taking? Authorizing Provider  erythromycin ophthalmic ointment Place a 1/2 inch ribbon of ointment into the lower eyelid. 08/04/19   Dahlia Byes A, NP  fluconazole (DIFLUCAN) 150 MG tablet Take 1 tablet (150 mg total) by mouth once a week. Take 1 tab weekly for 4 weeks 08/04/19   Janace Aris, NP    Family History Family History  Family history unknown: Yes    Social History Social History   Tobacco Use  . Smoking status: Former Smoker    Packs/day: 0.20  . Smokeless tobacco: Never Used  Substance Use Topics  . Alcohol use: Yes    Comment: Socially  . Drug use: Yes    Types: Marijuana     Allergies   Patient has no known allergies.   Review of Systems Review of Systems   Physical Exam Triage Vital Signs ED Triage Vitals [08/04/19 1441]  Enc Vitals Group     BP  138/81     Pulse Rate 72     Resp 18     Temp 98 F (36.7 C)     Temp Source Oral     SpO2 99 %     Weight      Height      Head Circumference      Peak Flow      Pain Score 5     Pain Loc      Pain Edu?      Excl. in GC?    No data found.  Updated Vital Signs BP 138/81 (BP Location: Right Arm)   Pulse 72   Temp 98 F (36.7 C) (Oral)   Resp 18   SpO2 99%   Visual Acuity Right Eye Distance: 20/100 Left Eye Distance: 20/50 Bilateral Distance: 20/50(pt does not have glasses but is supposed to)  Right Eye Near:   Left Eye Near:    Bilateral Near:     Physical Exam Vitals and nursing note reviewed.  Constitutional:      Appearance: Normal appearance.  HENT:     Head: Normocephalic and atraumatic.      Comments: Annular lesion to scalp.  Nose: Nose normal.  Eyes:     General: Lids are normal.     Extraocular Movements: Extraocular movements intact.     Conjunctiva/sclera: Conjunctivae normal.     Pupils: Pupils are equal, round, and reactive to light.     Comments: Mild left scleral injection No orbital pain No lid swelling  Stain procedure performed with corneal abrasion to 12:00 No foreign body.   Pulmonary:     Effort: Pulmonary effort is normal.  Musculoskeletal:        General: Normal range of motion.     Cervical back: Normal range of motion.  Skin:    General: Skin is warm and dry.  Neurological:     Mental Status: He is alert.  Psychiatric:        Mood and Affect: Mood normal.      UC Treatments / Results  Labs (all labs ordered are listed, but only abnormal results are displayed) Labs Reviewed - No data to display  EKG   Radiology No results found.  Procedures Procedures (including critical care time)  Medications Ordered in UC Medications - No data to display  Initial Impression / Assessment and Plan / UC Course  I have reviewed the triage vital signs and the nursing notes.  Pertinent labs & imaging results that were  available during my care of the patient were reviewed by me and considered in my medical decision making (see chart for details).     Corneal abrasion- nothing concerning on exam Treating with erythromycin ointment.  Monitor. Follow up with optho if needed.   Tinea capitis treating with fluconazole.  He has failed OTC cream. Follow up as needed for continued or worsening symptoms  Final Clinical Impressions(s) / UC Diagnoses   Final diagnoses:  Abrasion of left cornea, initial encounter  Tinea capitis     Discharge Instructions     Treating you for a corneal abrasion.  Use the antibiotic ointment as prescribed. Fluconazole for the ringworm of the scalp.  Take this weekly x4 weeks. Follow up as needed for continued or worsening symptoms     ED Prescriptions    Medication Sig Dispense Auth. Provider   erythromycin ophthalmic ointment Place a 1/2 inch ribbon of ointment into the lower eyelid. 1 g Dawayne Ohair A, NP   fluconazole (DIFLUCAN) 150 MG tablet Take 1 tablet (150 mg total) by mouth once a week. Take 1 tab weekly for 4 weeks 4 tablet Benoit Meech A, NP     PDMP not reviewed this encounter.   Loura Halt A, NP 08/05/19 0830

## 2019-11-25 ENCOUNTER — Encounter (HOSPITAL_COMMUNITY): Payer: Self-pay

## 2019-11-25 ENCOUNTER — Other Ambulatory Visit: Payer: Self-pay

## 2019-11-25 ENCOUNTER — Ambulatory Visit (HOSPITAL_COMMUNITY)
Admission: EM | Admit: 2019-11-25 | Discharge: 2019-11-25 | Disposition: A | Discharge status: Home / Self Care | Payer: Self-pay | Attending: Family Medicine | Admitting: Family Medicine

## 2019-11-25 DIAGNOSIS — M25531 Pain in right wrist: Secondary | ICD-10-CM

## 2019-11-25 DIAGNOSIS — M25532 Pain in left wrist: Secondary | ICD-10-CM

## 2019-11-25 DIAGNOSIS — M79642 Pain in left hand: Secondary | ICD-10-CM

## 2019-11-25 MED ORDER — PREDNISONE 10 MG (21) PO TBPK: ORAL_TABLET | Freq: Every day | ORAL | 0 refills | Status: DC

## 2019-11-25 NOTE — ED Triage Notes (Signed)
Pt presents with bilateral wrist and hand pain for about 3 weeks.

## 2019-11-26 NOTE — ED Provider Notes (Signed)
[facility]   [id number] 11/25/19 Arrival Time: 1936  ASSESSMENT & PLAN:  1. Bilateral wrist pain   2. Bilateral hand pain     No indication for imaging at this time. Discussed nature of pain related to repetitive motions at work.  Given bilateral wrist splints to wear as much as possible. Work note given.  Begin: Meds ordered this encounter  Medications  . predniSONE (STERAPRED UNI-PAK 21 TAB) 10 MG (21) TBPK tablet    Sig: Take by mouth daily. Take as directed.    Dispense:  21 tablet    Refill:  0    Orders Placed This Encounter  Procedures  . Apply Wrist brace    Recommend:  Follow-up Information    [facility].   Why: If worsening or failing to improve as anticipated. Contact information: [address] [place] [address] [phone]               Reviewed expectations re: course of current medical issues. Questions answered. Outlined signs and symptoms indicating need for more acute intervention. Patient verbalized understanding. After Visit Summary given.  SUBJECTIVE: History from: patient. Nicholas Webster is a 34 y.o. male who reports intermittent bilateral wrist pain with some distal "tingling". 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 No trauma.   Past Surgical History:  Procedure Laterality Date  . WRIST SURGERY Left       OBJECTIVE:  Vitals:   11/25/19 2004  BP: 117/74  Pulse: 78  Resp: 16  Temp: 98.4 F (36.9 C)  TempSrc: Oral  SpO2: 100%    General appearance: alert; no distress HEENT: Herm[place]; AT Neck: supple with FROM Resp: unlabored respirations Ext: bilateral appear normal; vague wrist soreness bilateral on exam Skin: warm and dry; no visible rashes Neurologic: gait normal; normal sensation and strength of bilateral UE Psychological: alert and cooperative; normal mood and affect    No Known  Allergies  History reviewed. No pertinent past medical history. Social History   Socioeconomic History  . Marital status: Married    Spouse name: Not on file  . Number of children: Not on file  . Years of education: Not on file  . Highest education level: Not on file  Occupational History  . Not on file  Tobacco Use  . Smoking status: Former Smoker    Packs/day: 0.20  . Smokeless tobacco: Never Used  Substance and Sexual Activity  . Alcohol use: Yes    Comment: Socially  . Drug use: Yes    Types: Marijuana  . Sexual activity: Not on file  Other Topics Concern  . Not on file  Social History Narrative  . Not on file   Social Determinants of Health   Financial Resource Strain:   . Difficulty of Paying Living Expenses:   Food Insecurity:   . Worried About Programme researcher, broadcasting/film/video in the Last Year:   . Barista in the Last Year:   Transportation Needs:   . Freight forwarder (Medical):   Marland Kitchen Lack of Transportation (Non-Medical):   Physical Activity:   . Days of Exercise per Week:   . Minutes of Exercise per Session:   Stress:   . Feeling of Stress :   Social Connections:   . Frequency of Communication with Friends and Family:   . Frequency of Social Gatherings with Friends and Family:   . Attends  Religious Services:   . Active Member of Clubs or Organizations:   . Attends Banker Meetings:   Marland Kitchen Marital Status:    Family History  Family history unknown: Yes   Past Surgical History:  Procedure Laterality Date  . WRIST SURGERY Left       [name], MD 11/26/19 [phone]

## 2022-08-14 ENCOUNTER — Ambulatory Visit: Payer: Self-pay | Admitting: Podiatry

## 2023-01-31 ENCOUNTER — Encounter (HOSPITAL_BASED_OUTPATIENT_CLINIC_OR_DEPARTMENT_OTHER): Payer: Self-pay | Admitting: Pediatrics

## 2023-01-31 ENCOUNTER — Other Ambulatory Visit: Payer: Self-pay

## 2023-01-31 ENCOUNTER — Emergency Department (HOSPITAL_BASED_OUTPATIENT_CLINIC_OR_DEPARTMENT_OTHER): Payer: Self-pay

## 2023-01-31 ENCOUNTER — Emergency Department (HOSPITAL_BASED_OUTPATIENT_CLINIC_OR_DEPARTMENT_OTHER)
Admission: EM | Admit: 2023-01-31 | Discharge: 2023-01-31 | Disposition: A | Discharge status: Home / Self Care | Payer: Self-pay | Attending: Emergency Medicine | Admitting: Emergency Medicine

## 2023-01-31 DIAGNOSIS — M25512 Pain in left shoulder: Secondary | ICD-10-CM | POA: Insufficient documentation

## 2023-01-31 MED ORDER — METHOCARBAMOL 500 MG PO TABS: 1000.0000 mg | ORAL_TABLET | Freq: Three times a day (TID) | ORAL | 0 refills | Status: AC | PRN

## 2023-01-31 MED ORDER — NAPROXEN 500 MG PO TABS: 500.0000 mg | ORAL_TABLET | Freq: Two times a day (BID) | ORAL | 0 refills | Status: AC

## 2023-01-31 NOTE — Discharge Instructions (Signed)
Please read and follow all provided instructions.  Your diagnoses today include:  1. Acute pain of left shoulder     Tests performed today include: An x-ray of the affected area - does NOT show any broken bones Vital signs. See below for your results today.   Medications prescribed:  Robaxin (methocarbamol) - muscle relaxer medication  DO NOT drive or perform any activities that require you to be awake and alert because this medicine can make you drowsy.   Naproxen - anti-inflammatory pain medication Do not exceed 500mg  naproxen every 12 hours, take with food  You have been prescribed an anti-inflammatory medication or NSAID. Take with food. Take smallest effective dose for the shortest duration needed for your pain. Stop taking if you experience stomach pain or vomiting.   Take any prescribed medications only as directed.  Home care instructions:  Follow any educational materials contained in this packet Follow R.I.C.E. Protocol: R - rest your injury  I  - use ice on injury without applying directly to skin C - compress injury with bandage or splint E - elevate the injury as much as possible  Follow-up instructions: Please follow-up with your primary care provider if you continue to have significant pain in 1 week. In this case you may have a more severe injury that requires further care.   Return instructions:  Please return if your fingers are numb or tingling, appear gray or blue, or you have severe pain (also elevate the arm and loosen splint or wrap if you were given one) Please return to the Emergency Department if you experience worsening symptoms.  Please return if you have any other emergent concerns.  Additional Information:  Your vital signs today were: BP 122/76 (BP Location: Right Arm)   Pulse 63   Temp 98.1 F (36.7 C) (Oral)   Resp 16   Ht 6\' 3"  (1.905 m)   Wt 93 kg   SpO2 99%   BMI 25.62 kg/m  If your blood pressure (BP) was elevated above 135/85  this visit, please have this repeated by your doctor within one month. --------------

## 2023-01-31 NOTE — ED Provider Notes (Signed)
[facility] Provider Note   CSN: [id number] Arrival date & time: 01/31/23  1215     History  Chief Complaint  Patient presents with   Shoulder Pain    Nicholas Webster is a 37 y.o. male.  Patient with no significant past medical history presents to the emergency department today for left shoulder pain.  He first noticed this when he woke up this morning.  He describes it as a tightness in his left shoulder.  It is worse with certain positions such as when he reaches out to try to open a door.  He denies associated chest pain, shortness of breath.  He has not had any vomiting or diaphoresis.  No history of cardiac disease or blood clots.  He works at a distribution center.  He denies any acute injuries but does do a lot of repetitive lifting.  No distal numbness or tingling.  Patient does typically take ibuprofen before work to prevent back pains.       Home Medications Prior to Admission medications   Medication Sig Start Date End Date Taking? Authorizing Provider  erythromycin ophthalmic ointment Place a 1/2 inch ribbon of ointment into the lower eyelid. 08/04/19   Dahlia Byes A, NP  fluconazole (DIFLUCAN) 150 MG tablet Take 1 tablet (150 mg total) by mouth once a week. Take 1 tab weekly for 4 weeks 08/04/19   Dahlia Byes A, NP  predniSONE (STERAPRED UNI-PAK 21 TAB) 10 MG (21) TBPK tablet Take by mouth daily. Take as directed. 11/25/19   [name], MD      Allergies    Patient has no known allergies.    Review of Systems   Review of Systems  Physical Exam Updated Vital Signs BP 122/76 (BP Location: Right Arm)   Pulse 63   Temp 98.1 F (36.7 C) (Oral)   Resp 16   Ht 6\' 3"  (1.905 m)   Wt 93 kg   SpO2 99%   BMI 25.62 kg/m  Physical Exam Vitals and nursing note reviewed.  Constitutional:      Appearance: He is well-developed.  HENT:     Head: Normocephalic and atraumatic.  Eyes:     Conjunctiva/sclera: Conjunctivae  normal.  Cardiovascular:     Pulses: Normal pulses. No decreased pulses.  Musculoskeletal:        General: Tenderness present.     Cervical back: Normal range of motion and neck supple. No spasms. Normal range of motion.     Thoracic back: No spasms or tenderness.     Lumbar back: No spasms or tenderness.     Right lower leg: No edema.     Left lower leg: No edema.  Skin:    General: Skin is warm and dry.  Neurological:     Mental Status: He is alert.     Sensory: No sensory deficit.     Comments: Motor, sensation, and vascular distal to the injury is fully intact.   Psychiatric:        Mood and Affect: Mood normal.     ED Results / Procedures / Treatments   Labs (all labs ordered are listed, but only abnormal results are displayed) Labs Reviewed - No data to display  EKG EKG Interpretation Date/Time:  Wednesday January 31 2023 14:18:23 EDT Ventricular Rate:  55 PR Interval:  137 QRS Duration:  84 QT Interval:  380 QTC Calculation: 364 R Axis:   74  Text Interpretation: Sinus rhythm  Probable left atrial enlargement Diffuse STE suspect early repolarization Confirmed by Alvira Monday ([id number]) on 01/31/2023 2:26:22 PM  Radiology No results found.  Procedures Procedures    Medications Ordered in ED Medications - No data to display  ED Course/ Medical Decision Making/ A&P    Patient seen and examined. History obtained directly from patient.   Labs/EKG: None ordered  Imaging: Ordered x-ray of the left shoulder.  Medications/Fluids: None ordered  Most recent vital signs reviewed and are as follows: BP 122/76 (BP Location: Right Arm)   Pulse 63   Temp 98.1 F (36.7 C) (Oral)   Resp 16   Ht 6\' 3"  (1.905 m)   Wt 93 kg   SpO2 99%   BMI 25.62 kg/m   Initial impression: Left shoulder strain and pain.  Patient does have signs of early repolarization on his EKG.  Symptoms are positional and readily reproducible.  He does not have any other symptoms other than  focal shoulder pain which could be construed as atypical angina or PE symptoms.  I have low concern for ACS, PE at this point.  3:03 PM Reassessment performed. Patient appears stable.  Comfortable.  Imaging personally visualized and interpreted including: Shoulder x-ray, agree negative, no dislocation.  Reviewed pertinent lab work and imaging with patient at bedside. Questions answered.   Most current vital signs reviewed and are as follows: BP 122/76 (BP Location: Right Arm)   Pulse 63   Temp 98.1 F (36.7 C) (Oral)   Resp 16   Ht 6\' 3"  (1.905 m)   Wt 93 kg   SpO2 99%   BMI 25.62 kg/m   Plan: Discharge to home.   Prescriptions written for: Naproxen, Robaxin  Patient counseled on proper use of muscle relaxant medication.  They were told not to drink alcohol, drive any vehicle, or do any dangerous activities while taking this medication.  Patient verbalized understanding.  Other home care instructions discussed: RICE protocol  ED return instructions discussed: New or worsening symptoms  Follow-up instructions discussed: Patient encouraged to follow-up with their PCP in 7 days if not improving.                                Medical Decision Making Amount and/or Complexity of Data Reviewed Radiology: ordered.  Risk Prescription drug management.   Patient presents with left shoulder pain.  This could be an overuse injury.  It is positional in nature and reproducible on exam.  EKG with early repolarization, low concern for ACS, PE, or other arrhythmia at this time.  No other symptoms which would be concerning for DVT.  No typical cardiac symptoms including shortness of breath, exertional pain, vomiting or diaphoresis.  Will treat as MSK pain at this time and have patient follow-up as needed.        Final Clinical Impression(s) / ED Diagnoses Final diagnoses:  Acute pain of left shoulder    Rx / DC Orders ED Discharge Orders          Ordered    methocarbamol  (ROBAXIN) 500 MG tablet  Every 8 hours PRN        01/31/23 1503    naproxen (NAPROSYN) 500 MG tablet  2 times daily        01/31/23 1503              Renne Crigler, PA-C 01/31/23 1505    Alvira Monday, MD 02/02/23 [phone]

## 2023-01-31 NOTE — ED Triage Notes (Signed)
Stated feels like he slept on his left shoulder wrong and having difficulty with certain movements.

## 2023-02-20 ENCOUNTER — Other Ambulatory Visit: Payer: Self-pay

## 2023-02-20 DIAGNOSIS — R61 Generalized hyperhidrosis: Secondary | ICD-10-CM | POA: Insufficient documentation

## 2023-02-20 NOTE — ED Triage Notes (Signed)
Pt works for [employer] in [employer] (top and bottom), shorts and a hoodie Became drenched with sweat, removed some clothing returned to freezer and became diaphoretic again. States this never has happened and came in for evaluation. No other symptoms BS 141 in triage

## 2023-02-21 ENCOUNTER — Encounter (HOSPITAL_BASED_OUTPATIENT_CLINIC_OR_DEPARTMENT_OTHER): Payer: Self-pay

## 2023-02-21 ENCOUNTER — Emergency Department (HOSPITAL_BASED_OUTPATIENT_CLINIC_OR_DEPARTMENT_OTHER)
Admission: EM | Admit: 2023-02-21 | Discharge: 2023-02-21 | Disposition: A | Discharge status: Home / Self Care | Payer: Self-pay | Attending: Emergency Medicine | Admitting: Emergency Medicine

## 2023-02-21 DIAGNOSIS — R61 Generalized hyperhidrosis: Secondary | ICD-10-CM

## 2023-02-21 LAB — CBC WITH DIFFERENTIAL/PLATELET
Abs Immature Granulocytes: 0.02 10*3/uL (ref 0.00–0.07)
Basophils Absolute: 0 10*3/uL (ref 0.0–0.1)
Basophils Relative: 0 %
Eosinophils Absolute: 0.1 10*3/uL (ref 0.0–0.5)
Eosinophils Relative: 1 %
HCT: 40 % (ref 39.0–52.0)
Hemoglobin: 12.8 g/dL — ABNORMAL LOW (ref 13.0–17.0)
Immature Granulocytes: 0 %
Lymphocytes Relative: 32 %
Lymphs Abs: 3.1 10*3/uL (ref 0.7–4.0)
MCH: 27.3 pg (ref 26.0–34.0)
MCHC: 32 g/dL (ref 30.0–36.0)
MCV: 85.3 fL (ref 80.0–100.0)
Monocytes Absolute: 0.8 10*3/uL (ref 0.1–1.0)
Monocytes Relative: 8 %
Neutro Abs: 5.5 10*3/uL (ref 1.7–7.7)
Neutrophils Relative %: 59 %
Platelets: 246 10*3/uL (ref 150–400)
RBC: 4.69 MIL/uL (ref 4.22–5.81)
RDW: 13.8 % (ref 11.5–15.5)
WBC: 9.5 10*3/uL (ref 4.0–10.5)
nRBC: 0 % (ref 0.0–0.2)

## 2023-02-21 LAB — COMPREHENSIVE METABOLIC PANEL
ALT: 23 U/L (ref 0–44)
AST: 26 U/L (ref 15–41)
Albumin: 3.1 g/dL — ABNORMAL LOW (ref 3.5–5.0)
Alkaline Phosphatase: 75 U/L (ref 38–126)
Anion gap: 9 (ref 5–15)
BUN: 13 mg/dL (ref 6–20)
CO2: 25 mmol/L (ref 22–32)
Calcium: 8.5 mg/dL — ABNORMAL LOW (ref 8.9–10.3)
Chloride: 104 mmol/L (ref 98–111)
Creatinine, Ser: 1.18 mg/dL (ref 0.61–1.24)
GFR, Estimated: >60 mL/min (ref >60)
Glucose, Bld: 115 mg/dL — ABNORMAL HIGH (ref 70–99)
Potassium: 3.7 mmol/L (ref 3.5–5.1)
Sodium: 138 mmol/L (ref 135–145)
Total Bilirubin: 0.2 mg/dL — ABNORMAL LOW (ref 0.3–1.2)
Total Protein: 5.7 g/dL — ABNORMAL LOW (ref 6.5–8.1)

## 2023-02-21 LAB — URINALYSIS, ROUTINE W REFLEX MICROSCOPIC
Bilirubin Urine: NEGATIVE
Glucose, UA: NEGATIVE mg/dL
Hgb urine dipstick: NEGATIVE
Ketones, ur: NEGATIVE mg/dL
Nitrite: NEGATIVE
Protein, ur: NEGATIVE mg/dL
Specific Gravity, Urine: >=1.03 (ref 1.005–1.030)
pH: 6 (ref 5.0–8.0)

## 2023-02-21 LAB — URINALYSIS, MICROSCOPIC (REFLEX)

## 2023-02-21 LAB — CBG MONITORING, ED: Glucose-Capillary: 151 mg/dL — ABNORMAL HIGH (ref 70–99)

## 2023-02-21 NOTE — ED Provider Notes (Addendum)
[facility]  Provider Note  CSN: [id number] Arrival date & time: 02/20/23 2345  History Chief Complaint  Patient presents with   diaphoresis    Nicholas Webster is a 37 y.o. 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 He typically wears thermals to stay warm while he is working. He reports tonight, about after he started work he broke out in a sweat. He took some layers off but did not improved. 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 He did not have any other symptoms of pre-syncope or CP during that time. He has otherwise been in his usual state of health. Denies any recent fever, cough congestion, CP, SOB, palpitations, N/V/D or dysuria. He feels fine now. At dinner before work. No prior history of similar.    Home Medications Prior to Admission medications   Medication Sig Start Date End Date Taking? Authorizing Provider  methocarbamol (ROBAXIN) 500 MG tablet Take 2 tablets (1,000 mg total) by mouth every 8 (eight) hours as needed for muscle spasms. 01/31/23   Renne Crigler, PA-C  naproxen (NAPROSYN) 500 MG tablet Take 1 tablet (500 mg total) by mouth 2 (two) times daily. 01/31/23   Renne Crigler, PA-C     Allergies    Patient has no known allergies.   Review of Systems   Review of Systems Please see HPI for pertinent positives and negatives  Physical Exam BP (!) 130/90 (BP Location: Left Arm)   Pulse 71   Temp 98 F (36.7 C)   Resp 18   Ht 6\' 3"  (1.905 m)   Wt 92 kg   SpO2 100%   BMI 25.35 kg/m   Physical Exam Vitals and nursing note reviewed.  Constitutional:      Appearance: Normal appearance.  HENT:     Head: Normocephalic and atraumatic.     Nose: Nose normal.     Mouth/Throat:     Mouth: Mucous membranes are moist.  Eyes:     Extraocular Movements: Extraocular movements intact.      Conjunctiva/sclera: Conjunctivae normal.  Cardiovascular:     Rate and Rhythm: Normal rate.  Pulmonary:     Effort: Pulmonary effort is normal.     Breath sounds: Normal breath sounds.  Abdominal:     General: Abdomen is flat.     Palpations: Abdomen is soft.     Tenderness: There is no abdominal tenderness.  Musculoskeletal:        General: No swelling. Normal range of motion.     Cervical back: Neck supple.  Skin:    General: Skin is warm and dry.  Neurological:     General: No focal deficit present.     Mental Status: He is alert.  Psychiatric:        Mood and Affect: Mood normal.     ED Results / Procedures / Treatments   EKG None  Procedures Procedures  Medications Ordered in the ED Medications - No data to display  Initial Impression and Plan  Patient here with an unexplained episode of diaphoresis, no other concerning symptoms recently and returned to baseline quickly. He has a reassuring exam and vitals. Does not appear dehydrated. Labs done in triage show unremarkable CBC, CMP. UA with WBC, but no urinary or other GU symptoms to suggest infection and no fever to explain his diaphoresis. He  feels well now and would like to go home. Recommend he stay hydrated, check temp occasionally during the next 24hours and RTED for any return of symptoms or any other concerns.   ED Course       MDM Rules/Calculators/A&P Medical Decision Making Problems Addressed: Diaphoresis: acute illness or injury  Amount and/or Complexity of Data Reviewed Labs: ordered. Decision-making details documented in ED Course.     Final Clinical Impression(s) / ED Diagnoses Final diagnoses:  Diaphoresis    Rx / DC Orders ED Discharge Orders     None        Pollyann Savoy, MD 02/21/23 0130

## 2023-02-21 NOTE — Discharge Instructions (Signed)
There is no clear explanation for you breaking out in a sweat tonight based on your ER workup. Please make sure you are staying hydrated, check your temperature tomorrow and return to the ER if you have any return of your symptoms or any other concerns.

## 2023-02-21 NOTE — ED Notes (Signed)
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   Removed some layers and returned to freezer became diaphoretic again and came here to be evaluated

## 2023-03-23 ENCOUNTER — Encounter (HOSPITAL_BASED_OUTPATIENT_CLINIC_OR_DEPARTMENT_OTHER): Payer: Self-pay

## 2023-03-23 ENCOUNTER — Emergency Department (HOSPITAL_BASED_OUTPATIENT_CLINIC_OR_DEPARTMENT_OTHER)
Admission: EM | Admit: 2023-03-23 | Discharge: 2023-03-23 | Disposition: A | Discharge status: Home / Self Care | Payer: Self-pay | Attending: Emergency Medicine | Admitting: Emergency Medicine

## 2023-03-23 ENCOUNTER — Other Ambulatory Visit: Payer: Self-pay

## 2023-03-23 DIAGNOSIS — Z202 Contact with and (suspected) exposure to infections with a predominantly sexual mode of transmission: Secondary | ICD-10-CM | POA: Insufficient documentation

## 2023-03-23 LAB — URINALYSIS, ROUTINE W REFLEX MICROSCOPIC
Bilirubin Urine: NEGATIVE
Glucose, UA: NEGATIVE mg/dL
Hgb urine dipstick: NEGATIVE
Ketones, ur: NEGATIVE mg/dL
Leukocytes,Ua: NEGATIVE
Nitrite: NEGATIVE
Protein, ur: NEGATIVE mg/dL
Specific Gravity, Urine: 1.025 (ref 1.005–1.030)
pH: 6.5 (ref 5.0–8.0)

## 2023-03-23 MED ORDER — METRONIDAZOLE 500 MG PO TABS
2000.0000 mg | ORAL_TABLET | Freq: Once | ORAL | Status: AC
  Administered 2023-03-23: 2000 mg via ORAL
  Filled 2023-03-23: qty 4

## 2023-03-23 NOTE — ED Triage Notes (Signed)
The patient was exposed to  with Trichomonas. No symptoms.

## 2023-03-23 NOTE — ED Provider Notes (Signed)
[place] [facility] AT [facility] Provider Note   CSN: [id number] Arrival date & time: 03/23/23  1147     History Chief Complaint  Patient presents with   SEXUALLY TRANSMITTED DISEASE    Jermale TREAVER JACQUOT is a 37 y.o. male reportedly otherwise healthy presents emerged from today for evaluation after known trichomonas exposure.  Patient reports that he was sexually active with this male partner 3 to 4 days ago and was told today that she tested positive for trichomonas.  He currently is not having any symptoms.  He denies any penile rash, penile pain/swelling, acicular pain/swelling, penile discharge, dysuria, hematuria, or any fever.  Denies any abdominal pain, nausea, vomiting.  He denies any allergies to any medications.  He reports that he thinks the patient was also tested for gonorrhea and chlamydia but reported that she had trichomonas.  HPI     Home Medications Prior to Admission medications   Medication Sig Start Date End Date Taking? Authorizing Provider  methocarbamol (ROBAXIN) 500 MG tablet Take 2 tablets (1,000 mg total) by mouth every 8 (eight) hours as needed for muscle spasms. 01/31/23   Renne Crigler, PA-C  naproxen (NAPROSYN) 500 MG tablet Take 1 tablet (500 mg total) by mouth 2 (two) times daily. 01/31/23   Renne Crigler, PA-C      Allergies    Patient has no known allergies.    Review of Systems   Review of Systems  Constitutional:  Negative for chills and fever.  HENT:  Negative for sore throat.   Gastrointestinal:  Negative for abdominal pain, nausea and vomiting.  Genitourinary:  Negative for dysuria, genital sores, hematuria, penile discharge, penile pain, penile swelling, scrotal swelling and testicular pain.  Musculoskeletal:  Negative for joint swelling.    Physical Exam Updated Vital Signs BP 121/77 (BP Location: Left Arm)   Pulse 71   Temp 97.9 F (36.6 C) (Oral)   Resp 16   Ht 6\' 3"  (1.905 m)   Wt 93 kg   SpO2 99%    BMI 25.62 kg/m  Physical Exam Vitals and nursing note reviewed.  Constitutional:      General: He is not in acute distress.    Appearance: He is not ill-appearing or toxic-appearing.  Eyes:     General: No scleral icterus. Pulmonary:     Effort: Pulmonary effort is normal. No respiratory distress.  Skin:    General: Skin is warm and dry.  Neurological:     Mental Status: He is alert. Mental status is at baseline.  Psychiatric:        Mood and Affect: Mood normal.     ED Results / Procedures / Treatments   Labs (all labs ordered are listed, but only abnormal results are displayed) Labs Reviewed  URINALYSIS, ROUTINE W REFLEX MICROSCOPIC  GC/CHLAMYDIA PROBE AMP () NOT AT [facility]    EKG None  Radiology No results found.  Procedures Procedures   Medications Ordered in ED Medications - No data to display  ED Course/ Medical Decision Making/ A&P                               Medical Decision Making Amount and/or Complexity of Data Reviewed Labs: ordered.  Risk Prescription drug management.   37 y.o. male presents to the ER for evaluation of STD exposure. Differential diagnosis includes but is not limited to STD. Patient is asymptomatic. Vital signs unremarkable. Physical  exam as noted above.   I independently reviewed and interpreted the patient's labs. Urinalysis unremarkable. GC pending.  Urinalysis unremarkable.  Patient reports that his partner was recently tested and was told today that she trichomonas.  He reports he think she was tested for gonorrhea chlamydia as well.  Given the patient has not had any symptoms, I did discuss with him about empiric treatment versus waiting.  Patient would like to wait for results.  I think this is reasonable.  Will give him Flagyl dose today of 2 g.  Discussed with him to not drink any alcohol today or for the next 3 days as this can make him extremely ill.  He is asymptomatic is not having any testicular pain or  swelling, penile pain or swelling, rash, discharge, dysuria.  He is stable for discharge home with health department follow-up.  We discussed the results of the labs/imaging. The plan is follow up with health department, do not consume EtOH. We discussed strict return precautions and red flag symptoms. The patient verbalized their understanding and agrees to the plan. The patient is stable and being discharged home in good condition.  Portions of this report may have been transcribed using voice recognition software. Every effort was made to ensure accuracy; however, inadvertent computerized transcription errors may be present.   Final Clinical Impression(s) / ED Diagnoses Final diagnoses:  None    Rx / DC Orders ED Discharge Orders     None         Achille Rich, PA-C 03/23/23 1313    Vanetta Mulders, MD 03/23/23 1546

## 2023-03-23 NOTE — Discharge Instructions (Addendum)
You were seen in the ER for evaluation after your exposure. You have been treated today. Please do not drink any alcohol while on this medication as it will make you extremely ill. Additionally, your partners will need to be tested and treated. Please make sure that you abstain from sex for the next two weeks to avoid re-infection. Please make sure to follow up with your MyChart results. Follow up with the health department. If you have any concerns, new or worsening symptoms, please return tot he nearest ER for re-evaluation.

## 2023-03-26 LAB — GC/CHLAMYDIA PROBE AMP (~~LOC~~) NOT AT ARMC
Chlamydia: NEGATIVE
Comment: NEGATIVE
Comment: NORMAL
Neisseria Gonorrhea: NEGATIVE

## 2024-04-27 ENCOUNTER — Emergency Department (HOSPITAL_BASED_OUTPATIENT_CLINIC_OR_DEPARTMENT_OTHER)
Admission: EM | Admit: 2024-04-27 | Discharge: 2024-04-28 | Disposition: A | Payer: Self-pay | Attending: Emergency Medicine | Admitting: Emergency Medicine

## 2024-04-27 ENCOUNTER — Other Ambulatory Visit: Payer: Self-pay

## 2024-04-27 ENCOUNTER — Encounter (HOSPITAL_BASED_OUTPATIENT_CLINIC_OR_DEPARTMENT_OTHER): Payer: Self-pay

## 2024-04-27 DIAGNOSIS — J029 Acute pharyngitis, unspecified: Secondary | ICD-10-CM | POA: Insufficient documentation

## 2024-04-27 NOTE — ED Triage Notes (Signed)
 Pt. Reports coming off of sickness. Job told him not to go to work sick.  Having heavy mucous last 3-4 days and sore throat. Pt. Hasn't taken any medication pta. Pt. Denies pain but has more of throat irritation

## 2024-04-28 LAB — RESP PANEL BY RT-PCR (RSV, FLU A&B, COVID)  RVPGX2
Influenza A by PCR: NEGATIVE
Influenza B by PCR: NEGATIVE
Resp Syncytial Virus by PCR: NEGATIVE
SARS Coronavirus 2 by RT PCR: NEGATIVE

## 2024-04-28 NOTE — ED Provider Notes (Signed)
  EMERGENCY DEPARTMENT AT [facility] Provider Note   CSN: [id number] Arrival date & time: 04/27/24  2342     Patient presents with: Sore Throat   Nicholas Webster is a 38 y.o. male.   The history is provided by the patient.  Sore Throat This is a new problem. The current episode started more than 2 days ago. The problem occurs constantly. The problem has not changed since onset.Pertinent negatives include no chest pain, no abdominal pain, no headaches and no shortness of breath. Nothing aggravates the symptoms. Nothing relieves the symptoms. He has tried nothing for the symptoms. The treatment provided no relief.       Prior to Admission medications   Medication Sig Start Date End Date Taking? Authorizing Provider  methocarbamol  (ROBAXIN ) 500 MG tablet Take 2 tablets (1,000 mg total) by mouth every 8 (eight) hours as needed for muscle spasms. 01/31/23   Geiple, Joshua, PA-C  naproxen  (NAPROSYN ) 500 MG tablet Take 1 tablet (500 mg total) by mouth 2 (two) times daily. 01/31/23   Geiple, Joshua, PA-C    Allergies: Patient has no known allergies.    Review of Systems  Constitutional:  Negative for fever.  HENT:  Positive for sore throat. Negative for drooling, facial swelling, trouble swallowing and voice change.   Respiratory:  Negative for shortness of breath, wheezing and stridor.   Cardiovascular:  Negative for chest pain.  Gastrointestinal:  Negative for abdominal pain.  Neurological:  Negative for headaches.  All other systems reviewed and are negative.   Updated Vital Signs BP 133/82   Pulse 88   Temp 98.7 F (37.1 C) (Oral)   Resp 18   SpO2 98%   Physical Exam Vitals and nursing note reviewed.  Constitutional:      General: He is not in acute distress.    Appearance: He is well-developed. He is not diaphoretic.  HENT:     Head: Normocephalic and atraumatic.     Nose: Nose normal.     Mouth/Throat:     Mouth: Mucous membranes are moist.      Pharynx: Oropharynx is clear. No oropharyngeal exudate or posterior oropharyngeal erythema.  Eyes:     Conjunctiva/sclera: Conjunctivae normal.     Pupils: Pupils are equal, round, and reactive to light.  Neck:     Comments: Intact phonation no pain with displacement of the larynx  Cardiovascular:     Rate and Rhythm: Normal rate and regular rhythm.     Pulses: Normal pulses.     Heart sounds: Normal heart sounds.  Pulmonary:     Effort: Pulmonary effort is normal.     Breath sounds: Normal breath sounds. No wheezing or rales.  Abdominal:     General: Bowel sounds are normal.     Palpations: Abdomen is soft.     Tenderness: There is no abdominal tenderness. There is no guarding or rebound.  Musculoskeletal:        General: Normal range of motion.     Cervical back: Normal range of motion and neck supple.  Lymphadenopathy:     Cervical: No cervical adenopathy.  Skin:    General: Skin is warm and dry.     Capillary Refill: Capillary refill takes less than 2 seconds.  Neurological:     General: No focal deficit present.     Mental Status: He is alert and oriented to person, place, and time.     Deep Tendon Reflexes: Reflexes normal.  Psychiatric:  Thought Content: Thought content normal.     (all labs ordered are listed, but only abnormal results are displayed) Labs Reviewed  RESP PANEL BY RT-PCR (RSV, FLU A&B, COVID)  [id number]    EKG: None  Radiology: No results found.   Procedures   Medications Ordered in the ED - No data to display                                  Medical Decision Making Sore throat for 5 days job sent him in   Amount and/or Complexity of Data Reviewed External Data Reviewed: notes.    Details: Previous notes reviewed  Labs: ordered.  Risk Risk Details: Very well appearing.  Resting comfortably in the bed.  Exam and vitals are benign and reassuring.  Highly doubt deep tissue infection.  I do not believe this patient needs advanced  imaging at this time.  Based on the Centor score there is no indication for additional testing or treatment.  Stable for discharge with close follow up.  Alternate tylenol  and ibuprofen  for pain.      Final diagnoses:  None   No signs of systemic illness or infection. The patient is nontoxic-appearing on exam and vital signs are within normal limits.  I have reviewed the triage vital signs and the nursing notes. Pertinent labs & imaging results that were available during my care of the patient were reviewed by me and considered in my medical decision making (see chart for details). After history, exam, and medical workup I feel the patient has been appropriately medically screened and is safe for discharge home. Pertinent diagnoses were discussed with the patient. Patient was given return precautions.   ED Discharge Orders     None          Dontez Hauss, MD 04/28/24 9982
